# Patient Record
Sex: Female | Born: 1985 | Race: White | Hispanic: No | Marital: Married | Smoking: Never smoker
Health system: Southern US, Community
[De-identification: ages and names within clinical notes are randomized; demographics above are authoritative.]

## PROBLEM LIST (undated history)

## (undated) ENCOUNTER — Emergency Department: Discharge status: Absent without leave | Payer: No Typology Code available for payment source

## (undated) ENCOUNTER — Inpatient Hospital Stay: Payer: Self-pay

## (undated) DIAGNOSIS — O99113 Other diseases of the blood and blood-forming organs and certain disorders involving the immune mechanism complicating pregnancy, third trimester: Secondary | ICD-10-CM

## (undated) DIAGNOSIS — G43829 Menstrual migraine, not intractable, without status migrainosus: Secondary | ICD-10-CM

## (undated) DIAGNOSIS — D696 Thrombocytopenia, unspecified: Secondary | ICD-10-CM

## (undated) HISTORY — PX: WISDOM TOOTH EXTRACTION: SHX21

---

## 2006-07-04 ENCOUNTER — Ambulatory Visit: Payer: Self-pay | Admitting: Family Medicine

## 2006-07-24 ENCOUNTER — Ambulatory Visit: Payer: Self-pay | Admitting: Family Medicine

## 2008-04-28 ENCOUNTER — Ambulatory Visit: Payer: Self-pay | Admitting: Family Medicine

## 2008-04-28 ENCOUNTER — Encounter: Payer: Self-pay | Admitting: Family Medicine

## 2010-03-09 ENCOUNTER — Ambulatory Visit: Payer: Self-pay | Admitting: Obstetrics & Gynecology

## 2010-03-10 ENCOUNTER — Encounter: Payer: Self-pay | Admitting: Obstetrics & Gynecology

## 2010-04-06 ENCOUNTER — Ambulatory Visit: Payer: Self-pay | Admitting: Obstetrics & Gynecology

## 2010-06-08 ENCOUNTER — Ambulatory Visit: Payer: Self-pay | Admitting: Family Medicine

## 2010-10-04 ENCOUNTER — Ambulatory Visit
Admission: RE | Admit: 2010-10-04 | Discharge: 2010-10-04 | Payer: Self-pay | Source: Home / Self Care | Attending: Obstetrics and Gynecology | Admitting: Obstetrics and Gynecology

## 2010-10-04 ENCOUNTER — Ambulatory Visit: Admit: 2010-10-04 | Payer: Self-pay | Admitting: Obstetrics & Gynecology

## 2011-02-08 NOTE — Assessment & Plan Note (Signed)
NAME:  Joyce Schwartz, Joyce Schwartz           ACCOUNT NO.:  [id number]   MEDICAL RECORD NO.:  [id number]          PATIENT TYPE:  POB   LOCATION:  [facility] at [facility]         FACILITY:  [facility]   PHYSICIAN:  Tinnie Gens, MD        DATE OF BIRTH:  31-Jan-1986   DATE OF SERVICE:  04/28/2008                                  CLINIC NOTE   CHIEF COMPLAINT:  Yearly exam.   HISTORY OF PRESENT ILLNESS:  The patient is a 25 year old nulligravida  who is not sexually active, who is here for first exam.  She had good  luck on Nordette previously and we would like to get back on them if  possible.  She has no other significant complaints today.  She will get  married in May of next year.   PAST MEDICAL HISTORY:  Negative.   PAST SURGICAL HISTORY:  Negative.   MEDICATIONS:  None known.   ALLERGIES:  None known.   OBSTETRICAL HISTORY:  G 0.   GYNECOLOGICAL HISTORY:  Menarche at age 50.   FAMILY HISTORY:  For diabetes.   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  Her boyfriend is a IT sales professional.  She uses occasional alcohol, but  no smoking or other drugs.   REVIEW OF SYSTEMS:  Fourteen-point review of systems is reviewed.  She  denies headache, fever, chills, nausea, vomiting, diarrhea,  constipation, dysuria or lower extremity swelling.   PHYSICAL EXAMINATION:  VITAL SIGNS:  As noted in the chart.  GENERAL:  She is a well-developed and well-nourished female, in no acute  distress.  HEENT:  Normocephalic, atraumatic.  Sclerae anicteric.  NECK:  Supple.  Normal thyroid.  LUNGS:  Clear bilaterally.  CARDIOVASCULAR:  Regular rate and rhythm without rubs, gallops, or  murmurs.  ABDOMEN:  Soft, nontender, and nondistended.  EXTREMITIES:  No cyanosis, clubbing, or edema.  BREASTS:  Symmetric with everted nipples.  No masses.  No supraclavicular or axillary adenopathy.  GU:  Normal external female genitalia.  BUS is normal.  Vagina is pink  and rugated.  The hymenal  ring is intact.  Cervix is nulliparous without  lesion.  Uterus is small and anteverted.  No adnexal masses or  tenderness.   IMPRESSION:  Initial GYN exam.   PLAN:  1. Pap smear today.  2. I have refilled her Nordette.  3. Place PPD today per her request.  4. Discussed Gardasil.  The patient will think about this and let us      know about it on her return.           ______________________________  Tinnie Gens, MD     TP/MEDQ  D:  04/28/2008  T:  04/29/2008  Job:  [id number]

## 2011-02-08 NOTE — Assessment & Plan Note (Signed)
NAME:  Joyce Schwartz, Joyce Schwartz           ACCOUNT NO.:  [id number]   MEDICAL RECORD NO.:  [id number]          PATIENT TYPE:  POB   LOCATION:  [facility] at [facility]         FACILITY:  [facility]   PHYSICIAN:  Jaynie Collins, MD     DATE OF BIRTH:  02-05-1986   DATE OF SERVICE:                                  CLINIC NOTE   REASON FOR VISIT:  Yearly examination.   The patient is a 25 year old nulligravida, who is here for her yearly  examination.  The patient also complains of itchy white discharge, which  she noted 2 days ago.  No other systemic symptoms.  She is sexually  active and is using condoms for contraception but wants a prescription  for Levora.  She has no other significant gynecologic complaints.  Since  her last exam, the patient reports that she was married for 3 months and  then got divorced.  She is in a relationship right now and has been for  the last 7 months.  She feels very safe in her relationship and has no  other concerns.   PAST OB/GYN HISTORY:  G0, menarche at age 22, regular menses, no  sexually transmitted infections.  No history of cervical dysplasia.  Her  last Pap smear was in August 2009 and was negative.  The patient has not  received the Gardasil series and is interested in receiving this.   PAST MEDICAL HISTORY:  None.   PAST SURGICAL HISTORY:  None.   MEDICATIONS:  Multivitamins.   ALLERGIES:  No known drug allergies.   SOCIAL HISTORY:  The patient works as a Armed forces operational officer.  She drinks  social alcohol, but no smoke or denies any drugs.  She lives with her  parents.   FAMILY HISTORY:  Only remarkable for diabetes.  She denies any  gynecologic colon or breast cancer history.   REVIEW OF SYSTEMS:  A 14-point review of systems was extensively  reviewed.  She denies any symptoms except for what was reported in the  history of present illness.   PHYSICAL EXAMINATION:  VITAL SIGNS:  Pulse 68, blood pressure 118/76,  weight 252 pounds, height 5 feet 4  inches.  GENERAL:  No apparent distress.  HEENT:  Normocephalic, atraumatic.  NECK:  Supple.  Normal thyroid.  LUNGS:  Clear to auscultation bilaterally.  HEART:  Regular rate and rhythm.  ABDOMEN:  Soft, nontender, nondistended.  BREASTS:  Symmetric, soft, nontender.  No abnormal masses, skin changes,  nipple drainage, or lymphadenopathy noted.  EXTREMITIES:  No cyanosis, clubbing, or edema.  PELVIC:  Normal external female genitalia.  Pink, well-rugated vagina.  Nulliparous cervix without lesions.  Uterus is small and anteverted.  No  adnexal masses or tenderness.  Pap smear was obtained.  There was some  thick white discharge noted in the vagina and a sample was taken for wet  prep.  The patient has small amount of bleeding after the Pap smear.  Bimanual examination was normal.   IMPRESSION:  Annual gynecologic examination, also likely candidal  vaginitis.   PLAN:  1. We will follow up Pap smear results.  The patient is also      interested in having reflex gonorrhea and  Chlamydia checked but      declines other sexually transmitted infection screening.  2. We will follow up wet prep, but the patient was given a      prescription for Diflucan to treat for presumptive of candidiasis.  3. The patient was given a refill of her Levora for birth control.  I      told to continue using condoms for sexually transmitted infection      prevention.  4. We will set up for the patient to come back for her Gardasil series      vaccination.           ______________________________  Jaynie Collins, MD     UA/MEDQ  D:  03/09/2010  T:  03/10/2010  Job:  [id number]

## 2011-02-08 NOTE — Assessment & Plan Note (Signed)
NAME:  Joyce Schwartz, Joyce Schwartz           ACCOUNT NO.:  [id number]   MEDICAL RECORD NO.:  [id number]          PATIENT TYPE:  POB   LOCATION:  [facility]         FACILITY:  [facility]   PHYSICIAN:  Tinnie Gens, MD        DATE OF BIRTH:  Feb 13, 1986   DATE OF SERVICE:                                    CLINIC NOTE   [facility] [facility]   CHIEF COMPLAINT:  Yearly exam.   HISTORY OF PRESENT ILLNESS:  The patient is a 25 year old nulligravida who  has also never been sexually active, who is here for her first exam.  She  complains that her cycles are very irregular.  She sometimes goes 6-8 weeks  between cycles and then they may be fairly heavy when they come.  She also  has some significant pain with her periods.   The patient also complains of a rash between her breasts that seems to be  worrisome to her.   PAST MEDICAL HISTORY:  Negative.   PAST SURGICAL HISTORY:  Negative.   MEDICATIONS:  None.   ALLERGIES:  NONE KNOWN.   OBSTETRIC HISTORY:  G0.   GYNECOLOGIC HISTORY:  Menarche at age 63.  Irregular cycles as described  above.   FAMILY HISTORY:  Diabetes.   SOCIAL HISTORY:  The patient is a Consulting civil engineer at [employer].  She has the boyfriend she  has had for the last 6 years.  She lives with her parents and her sister.  She does not smoke, use alcohol or do any drugs.   REVIEW OF SYSTEMS:  A 14-point review of systems is reviewed.  Please see  GYN history in the chart.  It is negative.   PHYSICAL EXAMINATION:  VITAL SIGNS:  Blood pressure is 108/73, weight is 174  and pulse is 78.  GENERAL: She is a well-developed, well-nourished female in no acute  distress.  LUNGS:  Clear bilaterally.  CV:  Regular rate and rhythm.  No rubs, gallops or murmurs.  NECK:  Supple with normal thyroid.  BREASTS:  Symmetric with everted nipples.  No masses.  No supraclavicular or  axillary adenopathy.  ABDOMEN:  Soft, nontender and nondistended.  EXTREMITIES:  No cyanosis, clubbing  or edema and 2+ distal pulses.  GU EXAM:  Deferred at this time.  SKIN:  There is hypo and hyperpigmented areas along the chest of the patient  with some mild raised boarders.   IMPRESSION:  1. Irregular cycles.  2. Tinea versicolor.   PLAN:  1. Starting her on Depo on the Sunday after her next cycle starts.  This      will regulate her periods.  2. No dry shampoo to the areas affected by the tinea versicolor.  3. Discussed implications for starting annual Pap smear screening at age      22  or when she has been sexually active for 3 years.  Being that she is      not sexually active currently, 22 will be the time for her to come back      for her annual checkups.  4. Did advise about condom use and other safety issues, as well as alcohol  and drug use, seatbelt wear and skin care.           ______________________________  Tinnie Gens, MD     TP/MEDQ  D:  07/04/2006  T:  07/05/2006  Job:  [id number]

## 2011-04-08 ENCOUNTER — Other Ambulatory Visit: Payer: Self-pay | Admitting: Family Medicine

## 2012-05-14 ENCOUNTER — Inpatient Hospital Stay: Payer: Self-pay

## 2012-05-14 LAB — CBC WITH DIFFERENTIAL/PLATELET
Basophil %: 0.2 %
Eosinophil #: 0 10*3/uL (ref 0.0–0.7)
HCT: 38.8 % (ref 35.0–47.0)
HGB: 13.6 g/dL (ref 12.0–16.0)
Lymphocyte #: 1.2 10*3/uL (ref 1.0–3.6)
Monocyte #: 0.6 x10 3/mm (ref 0.2–0.9)
Neutrophil #: 13.8 10*3/uL — ABNORMAL HIGH (ref 1.4–6.5)
Neutrophil %: 88.5 %
RBC: 4.48 10*6/uL (ref 3.80–5.20)
RDW: 13.5 % (ref 11.5–14.5)
WBC: 15.6 10*3/uL — ABNORMAL HIGH (ref 3.6–11.0)

## 2012-05-16 LAB — HEMATOCRIT: HCT: 28.7 % — ABNORMAL LOW (ref 35.0–47.0)

## 2014-09-26 NOTE — L&D Delivery Note (Signed)
Delivery Note At 3:42 PM a viable female was delivered via Vaginal, Spontaneous Delivery (Presentation: direct OA).  APGAR: 8, 8; weight 7 lb 13.2 oz (3550 g).   Placenta status: Intact, Spontaneous.  Cord:  with the following complications: none apparent.  Cord pH:  n/a  Anesthesia:  none Episiotomy:  no Lacerations: 1st degree;Labial Suture Repair: 3.0 chromic Est. Blood Loss (mL):  250cc  Mom to postpartum.  Baby to Couplet care / Skin to Skin.  Patient progressed to complete and pushed, short 2nd stage.  Baby delivered and placed immediately on mom's chest.  Cord palpated until pulseless and then doubly clamped and cut by FOB.  Small right labial teal near precupice, repaired with 4-0 chromic in a figure of 8.  Placenta delivered after this and fundus was firm with massage and post partum pitocin bolus.  We sang happy birthday to JerseyHolden, and mom and baby recovered in the delivery room.  Ward, Chelsea C 08/08/2015, 4:34 PM

## 2015-02-03 NOTE — H&P (Signed)
L&D Evaluation:  History:   HPI 29 year old G1 P0 with EDC=05/13/2012 by a 449w1day ultrasound presents to L&D at 40 1/7 weeks with c/o strong regular contractions since 1630 today. Denies LOF but has had some bloody show. Baby active. PNC began in the first trimester and remarkable for a 9 week dating ultrasound, a negative first trimester test, an elevated one hr GTT with a normal 3hr GTT, and a negative GBS culture. Baby had a hydrocele noted on today's ultrasound. BPP 8/10 today (no breathing). LABS: B POS, RI, VI.    Presents with contractions    Patient's Medical History No Chronic Illness    Patient's Surgical History none    Medications Pre Natal Vitamins    Allergies NKDA    Social History none    Family History Non-Contributory   ROS:   ROS see HPI   Exam:   Vital Signs initial BP=140/82, repeat=110/72    General breathing with contractions    Mental Status clear    Chest clear    Heart normal sinus rhythm, no murmur/gallop/rubs    Abdomen gravid, tender with contractions    Estimated Fetal Weight Average for gestational age, EFW=7 1/2#    Fetal Position cephalic    Edema no edema    Reflexes 2+    Pelvic no external lesions, 3-4/80%/-1    Mebranes Intact    FHT normal rate with no decels, 130 baseline    Ucx q2    Skin dry   Impression:   Impression IUP at 40 1/7 weeks in early labor   Plan:   Plan EFM/NST, monitor contractions and for cervical change, Epidural when appropriate   Electronic Signatures: Trinna BalloonGutierrez, Aleksandra Raben L (CNM)  (Signed 19-Aug-13 19:49)  Authored: L&D Evaluation   Last Updated: 19-Aug-13 19:49 by Trinna BalloonGutierrez, Kejon Feild L (CNM)

## 2015-03-12 ENCOUNTER — Ambulatory Visit: Admission: RE | Admit: 2015-03-12 | Payer: Self-pay | Source: Ambulatory Visit

## 2015-03-16 ENCOUNTER — Ambulatory Visit
Admission: RE | Admit: 2015-03-16 | Discharge: 2015-03-16 | Disposition: A | Discharge status: Home / Self Care | Payer: No Typology Code available for payment source | Source: Ambulatory Visit | Attending: Obstetrics and Gynecology | Admitting: Obstetrics and Gynecology

## 2015-03-16 DIAGNOSIS — D696 Thrombocytopenia, unspecified: Secondary | ICD-10-CM | POA: Diagnosis not present

## 2015-03-16 DIAGNOSIS — O3660X0 Maternal care for excessive fetal growth, unspecified trimester, not applicable or unspecified: Secondary | ICD-10-CM

## 2015-03-16 LAB — CBC WITH DIFFERENTIAL/PLATELET
BASOS ABS: 0 10*3/uL (ref 0–0.1)
Basophils Relative: 0 %
Eosinophils Absolute: 0 10*3/uL (ref 0–0.7)
HCT: 38.4 % (ref 35.0–47.0)
Hemoglobin: 13.4 g/dL (ref 12.0–16.0)
Lymphs Abs: 1.2 10*3/uL (ref 1.0–3.6)
MCH: 29.8 pg (ref 26.0–34.0)
MCHC: 34.8 g/dL (ref 32.0–36.0)
MCV: 85.8 fL (ref 80.0–100.0)
Monocytes Absolute: 0.4 10*3/uL (ref 0.2–0.9)
Monocytes Relative: 4 %
NEUTROS ABS: 8.3 10*3/uL — AB (ref 1.4–6.5)
Neutrophils Relative %: 84 %
PLATELETS: 93 10*3/uL — AB (ref 150–440)
RBC: 4.48 MIL/uL (ref 3.80–5.20)
RDW: 13.3 % (ref 11.5–14.5)
WBC: 9.9 10*3/uL (ref 3.6–11.0)

## 2015-03-16 NOTE — Addendum Note (Signed)
Encounter addended by: Jimmey Ralph, MD on: 03/16/2015  4:37 PM<BR>     Documentation filed: Notes Section

## 2015-03-16 NOTE — Progress Notes (Signed)
[facility] Consultation   Chief Complaint: Low platelet count  on new ob labs   HPI: [name] is a 29 y.o. G2P1000 at [redacted]w[redacted]d by LMP 2/13 and EDC of 08/15/15 (confirmed by u/s on 4/11)   who presents in consultation from Dr Annamarie Major at [facility]  for Thrombocytopenia . Pt has had no problems with easy bleeding and was unaware of a platelet issue until she presented in labor with her now 29 yo,  Joyce Schwartz, in 2013 .  She had an uncomplicated pregnancy then  and her CBC showed a platelet count of 97K . She was unable to have an epidural due to low platelet count and she went on to have a VAVD of a 9lb 8oz female . Neither she not the infant had any sequelae - she was unaware of any plt problems for her son. She has had blood counts drawn for work outside of pregnancy and was never told of a problem. Likewise she was never notified of a problem on the blood work done earlier in her first pregnancy. (Epic only contains the delivery CBC) . She did not have other signs of preeclampsia in that pregnancy.  This pregnancy her first CBC showed 120K and f/u was 118K and 108K . She denies nosebleeds or rash . She is not on any medications except vitamins . HIV test was negative - she has no lupus sxs. No PMH to suggest plt  sequestration.  Past Medical History: Patient  has no past medical history on file.  Past Surgical History: She  has no past surgical history on file.  Obstetric History:  OB History    Gravida Para Term Preterm AB TAB SAB Ectopic Multiple Living   2 1 1             Gynecologic History:  Patient's last menstrual period was 11/08/2014.     Medications vits  Allergies: Patient has no allergies on file.  Social History: Patient   is married and has one son  Family History: family history is not on file.  Review of Systems A full 12 point review of systems was negative or as noted in the History of Present Illness.  Physical Exam: LMP 11/08/2014 There were no  vitals filed for this visit.   Well appearing WF  No rash    Asessement: 1. Thrombocytopenia   Asymptomatic . Pt reports no low plts outside of pregnancy. No results available since last delivery.  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 (She is HIV negative and has no sxs to suggest lupus related drop in platelets. Her other blood indices are normal . She is on no meds and has no liver or spleen dz. No recent viral illness.  ) I told her that both are diagnoses of exclusion and checking for antiplatelet antibodies is unlikely to be of assistance in her management .  I reassured the pt that at this platelet count she is unlikely to have any problems.  I reviewed with the patient that recent data suggests that even with ITP related drop in maternal plts,  ITP is unlikely to cause a clinically dangerous  drop in the fetus.  Labor is usually conducted normally now with no fetal platelet assessment. I do usually inform peds of maternal thrombocytopenia and offer to draw a  plt count on cord blood if they would like. And while I prefer to avoid operative vaginal delivery with maternal  ITP, there is not a strict contraindication . Cut off for Obstetric regional anesthesia varies among anesthesiologists usually between 60-100K and some will perform a one shot spinal at lower levels than an epidural  For cesarean to avoid GET anesthesia .   2 h/o LGA infant - neg early GCT , required operative vaginal delivery  Plan: 1 repeat CBC with manual platelet count to  r/o falsely low count due to clumping - ordered today  2 ANA to r/o autoimmune causes though unlikely  3 repeat CBC monthly  If platelet count continues to drift downward toward  the  30-50K range, recommend involvement of hematology to assist with trial of corticosteroids . I prefer a count >= 50K  in labor  Or for operative procedures.  RTC in 4 weeks for MFM consult to re-assess .    4 Consider third trimester growth san at 32 -34 weeks to assess fetal weight , if projected term EFW is >= 5K g  Offer primary cesarean   . Total time spent with the patient was 30 minutes with greater than 50% spent in counseling and coordination of care. We appreciate this interesting consult and will be happy to be involved in the ongoing care of Joyce Schwartz in anyway her obstetricians desire.  Jimmey Ralph, MD

## 2015-03-16 NOTE — Progress Notes (Signed)
Discussed with Dr Fayrene Fearing She thinks most c/w ITP  Refer to [facility] Mate pp She also suggests adding a CMP and hep C ab to next blood draw

## 2015-03-17 LAB — ANA COMPREHENSIVE PANEL

## 2015-04-20 ENCOUNTER — Ambulatory Visit
Admission: RE | Admit: 2015-04-20 | Discharge: 2015-04-20 | Disposition: A | Discharge status: Home / Self Care | Payer: No Typology Code available for payment source | Source: Ambulatory Visit | Attending: Obstetrics and Gynecology | Admitting: Obstetrics and Gynecology

## 2015-04-20 VITALS — BP 125/70 | HR 87 | Temp 98.0°F | Ht 65.0 in | Wt 172.0 lb

## 2015-04-20 DIAGNOSIS — D696 Thrombocytopenia, unspecified: Secondary | ICD-10-CM | POA: Insufficient documentation

## 2015-04-20 LAB — CBC WITH DIFFERENTIAL/PLATELET
BASOS ABS: 0 10*3/uL (ref 0–0.1)
Basophils Relative: 0 %
EOS ABS: 0 10*3/uL (ref 0–0.7)
Eosinophils Relative: 0 %
HEMATOCRIT: 36.7 % (ref 35.0–47.0)
HEMOGLOBIN: 12.8 g/dL (ref 12.0–16.0)
LYMPHS ABS: 1.2 10*3/uL (ref 1.0–3.6)
MCH: 29.7 pg (ref 26.0–34.0)
MCHC: 34.8 g/dL (ref 32.0–36.0)
MCV: 85.3 fL (ref 80.0–100.0)
Monocytes Absolute: 0.4 10*3/uL (ref 0.2–0.9)
Monocytes Relative: 4 %
NEUTROS ABS: 8.5 10*3/uL — AB (ref 1.4–6.5)
Neutrophils Relative %: 84 %
PLATELETS: 96 10*3/uL — AB (ref 150–440)
RBC: 4.3 MIL/uL (ref 3.80–5.20)
RDW: 13.7 % (ref 11.5–14.5)
WBC: 10.2 10*3/uL (ref 3.6–11.0)

## 2015-04-20 NOTE — Progress Notes (Signed)
MFM follow up consult.  Ms. Raybon presents for follow up discussion of her thrombocytopenia.  She was previously seen by Dr. Leatha Gilding (see full consult for details).  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  She was able to obtain her labs from her first pregnancy; plts were 160 at baseline and 102 at 28 weeks.  At the time of her delivery, they were 64 and she was unable to get an epidural for labor management.  She had no issues with PP bleeding and has had no issues with her menses, gum bleeding or nose bleeds or bruising since that time.  This pregnancy her plts have ranged from 120, 118, 108 and 93.  Today they are 96.  RPR was NR, HIV was negative and ANA panel was negative.  Medication history was reviewed and only meds include PNVs.  Agree with plan to follow cbc monthly.  We would be happy to review these results if needed and help to refer to hematology if plts drop to the 50-70K range for consideration of steroids.    Anesthesia consult may be beneficial to discuss goals of therapy if needed to maintain plts above a certain level for them to be comfortable with a regional anesthetic.

## 2015-06-15 ENCOUNTER — Encounter: Payer: No Typology Code available for payment source | Attending: Obstetrics & Gynecology | Admitting: Dietician

## 2015-06-15 VITALS — BP 130/78 | Ht 65.0 in | Wt 178.7 lb

## 2015-06-15 DIAGNOSIS — O24419 Gestational diabetes mellitus in pregnancy, unspecified control: Secondary | ICD-10-CM | POA: Insufficient documentation

## 2015-06-15 DIAGNOSIS — O24410 Gestational diabetes mellitus in pregnancy, diet controlled: Secondary | ICD-10-CM

## 2015-06-15 NOTE — Patient Instructions (Signed)
Read booklet on Gestational Diabetes Follow Gestational Meal Planning Guidelines Complete a 3 Day Food Record and bring to next appointment Check blood sugars 4 x day - before breakfast and 2 hrs after every meal and record  Call MD for prescription for meter strips and lancets Strips:  Ultra One Touch  Lancets:   One Touch Delica Bring blood sugar log to next appointment Walk 20-30 minutes at least 5 x week if permitted by MD Next appointment    06-22-15

## 2015-06-15 NOTE — Progress Notes (Signed)
Diabetes Self-Management Education  Visit Type: First/Initial  Appt. Start Time: 1030 Appt. End Time:1200  06/15/2015  [name], identified by name and date of birth, is a 29 y.o. female with a diagnosis of Diabetes: Gestational Diabetes.   ASSESSMENT  Blood pressure 130/78, height  (1.651 m), weight 178 lb 11.2 oz (81.058 kg), last menstrual period 11/08/2014. Body mass index is 29.74 kg/(m^2).      Diabetes Self-Management Education - 06/15/15 1322    Visit Information   Visit Type First/Initial   Initial Visit   Diabetes Type Gestational Diabetes   Health Coping   How would you rate your overall health? Good   Psychosocial Assessment   Patient Belief/Attitude about Diabetes Motivated to manage diabetes   Self-care barriers None   Other persons present Spouse/SO   Patient Concerns Healthy Lifestyle;Glycemic Control   Special Needs None   Preferred Learning Style Auditory;Visual   Learning Readiness Ready   How often do you need to have someone help you when you read instructions, pamphlets, or other written materials from your doctor or pharmacy? 1 - Never   What is the last grade level you completed in school? 16   Complications   How often do you check your blood sugar? 0 times/day (not testing)   Have you had a dilated eye exam in the past 12 months? No   Have you had a dental exam in the past 12 months? Yes   Are you checking your feet? No   Dietary Intake   Breakfast --  eats 3 meals/day-eats fried foods 2-3x/wk and snack foods 4-5x/wk.; eats sweets/desserts 2-3x/wk.   Snack (morning) --  eats am and bedtime snacks    Snack (afternoon) --  none   Beverage(s) --  drinks sweetened beverages 2-3/day   Exercise   Exercise Type --  walks 45 min 2-3x/wk   Patient Education   Previous Diabetes Education No   Disease state  Definition of diabetes, type 1 and 2, and the diagnosis of diabetes;Factors that contribute to the development of diabetes   Nutrition management  Role of diet in the treatment of diabetes and the relationship between the three main macronutrients and blood glucose level;Food label reading, portion sizes and measuring food.;Carbohydrate counting   Physical activity and exercise  Role of exercise on diabetes management, blood pressure control and cardiac health.   Monitoring Taught/evaluated SMBG meter.;Purpose and frequency of SMBG.;Identified appropriate SMBG and/or A1C goals.;Ketone testing, when, how.;Taught/discussed recording of test results and interpretation of SMBG.  gave pt Ultra One Touch 2 meter and instructed on its use-BG 76 (2 hr pp)   Acute complications Discussed and identified patients' treatment of hyperglycemia.   Psychosocial adjustment Role of stress on diabetes   Preconception care Pregnancy and GDM  Role of pre-pregnancy blood glucose control on the development of the fetus;Reviewed with patient blood glucose goals with pregnancy;Role of family planning for patients with diabetes   Personal strategies to promote health Lifestyle issues that need to be addressed for better diabetes care      Individualized Plan for Diabetes Self-Management Training:   Learning Objective:  Patient will have a greater understanding of diabetes self-management. Patient education plan is to attend individual and/or group sessions per assessed needs and concerns.   Plan:   Patient Instructions  Read booklet on Gestational Diabetes Follow Gestational Meal Planning Guidelines Complete a 3 Day Food Record and bring to next appointment Check blood sugars 4 x day - before breakfast  and 2 hrs after every meal and record  Call MD for prescription for meter strips and lancets Strips:  Ultra One Touch  Lancets:   One Touch Delica Bring blood sugar log to next appointment Walk 20-30 minutes at least 5 x week if permitted by MD Next appointment    06-22-15   Expected Outcomes:     Education material provided: General  meal planning guidelines for pregnancy, Ultra One Touch 2 meter  If problems or questions, patient to contact team via:  [phone]  Future DSME appointment:   06-22-15

## 2015-06-22 ENCOUNTER — Observation Stay: Payer: No Typology Code available for payment source

## 2015-06-22 ENCOUNTER — Ambulatory Visit: Payer: No Typology Code available for payment source | Admitting: Dietician

## 2015-06-22 ENCOUNTER — Observation Stay
Admission: RE | Admit: 2015-06-22 | Discharge: 2015-06-22 | Disposition: A | Discharge status: Home / Self Care | Payer: No Typology Code available for payment source | Source: Ambulatory Visit | Attending: Certified Nurse Midwife | Admitting: Certified Nurse Midwife

## 2015-06-22 DIAGNOSIS — Z3A32 32 weeks gestation of pregnancy: Secondary | ICD-10-CM | POA: Insufficient documentation

## 2015-06-22 DIAGNOSIS — D696 Thrombocytopenia, unspecified: Secondary | ICD-10-CM | POA: Insufficient documentation

## 2015-06-22 DIAGNOSIS — R05 Cough: Secondary | ICD-10-CM | POA: Diagnosis present

## 2015-06-22 DIAGNOSIS — R030 Elevated blood-pressure reading, without diagnosis of hypertension: Secondary | ICD-10-CM

## 2015-06-22 DIAGNOSIS — IMO0001 Reserved for inherently not codable concepts without codable children: Secondary | ICD-10-CM

## 2015-06-22 DIAGNOSIS — O24419 Gestational diabetes mellitus in pregnancy, unspecified control: Secondary | ICD-10-CM | POA: Diagnosis present

## 2015-06-22 DIAGNOSIS — O26892 Other specified pregnancy related conditions, second trimester: Secondary | ICD-10-CM | POA: Diagnosis not present

## 2015-06-22 DIAGNOSIS — J40 Bronchitis, not specified as acute or chronic: Secondary | ICD-10-CM | POA: Insufficient documentation

## 2015-06-22 DIAGNOSIS — R059 Cough, unspecified: Secondary | ICD-10-CM

## 2015-06-22 LAB — COMPREHENSIVE METABOLIC PANEL
ALBUMIN: 3 g/dL — AB (ref 3.5–5.0)
ALK PHOS: 101 U/L (ref 38–126)
ALT: 10 U/L — ABNORMAL LOW (ref 14–54)
ANION GAP: 6 (ref 5–15)
AST: 17 U/L (ref 15–41)
BILIRUBIN TOTAL: 0.8 mg/dL (ref 0.3–1.2)
BUN: 6 mg/dL (ref 6–20)
CALCIUM: 8.4 mg/dL — AB (ref 8.9–10.3)
CO2: 24 mmol/L (ref 22–32)
Chloride: 108 mmol/L (ref 101–111)
Creatinine, Ser: 0.54 mg/dL (ref 0.44–1.00)
GFR calc Af Amer: >60 mL/min (ref >60)
GLUCOSE: 80 mg/dL (ref 65–99)
POTASSIUM: 4.2 mmol/L (ref 3.5–5.1)
Sodium: 138 mmol/L (ref 135–145)
TOTAL PROTEIN: 6.2 g/dL — AB (ref 6.5–8.1)

## 2015-06-22 LAB — CBC
HEMATOCRIT: 35.7 % (ref 35.0–47.0)
HEMOGLOBIN: 12.4 g/dL (ref 12.0–16.0)
MCH: 29.9 pg (ref 26.0–34.0)
MCHC: 34.8 g/dL (ref 32.0–36.0)
MCV: 85.8 fL (ref 80.0–100.0)
Platelets: 114 10*3/uL — ABNORMAL LOW (ref 150–440)
RBC: 4.16 MIL/uL (ref 3.80–5.20)
RDW: 14 % (ref 11.5–14.5)
WBC: 10 10*3/uL (ref 3.6–11.0)

## 2015-06-22 LAB — PROTEIN / CREATININE RATIO, URINE
CREATININE, URINE: 36 mg/dL
PROTEIN CREATININE RATIO: 0.19 mg/mg{creat} — AB (ref 0.00–0.15)
Total Protein, Urine: 7 mg/dL

## 2015-06-22 MED ORDER — LEVALBUTEROL HCL 1.25 MG/0.5ML IN NEBU
1.2500 mg | INHALATION_SOLUTION | RESPIRATORY_TRACT | Status: DC | PRN
  Administered 2015-06-22: 1.25 mg via RESPIRATORY_TRACT
  Filled 2015-06-22 (×2): qty 0.5

## 2015-06-22 MED ORDER — AZITHROMYCIN 250 MG PO TABS
500.0000 mg | ORAL_TABLET | Freq: Once | ORAL | Status: AC
  Administered 2015-06-22: 500 mg via ORAL
  Filled 2015-06-22: qty 2

## 2015-06-22 MED ORDER — LACTATED RINGERS IV SOLN: 500.0000 mL | INTRAVENOUS | Status: DC | PRN

## 2015-06-22 NOTE — Progress Notes (Addendum)
L&D Triage Note   S: "I was able to cough up some sputum after that nebulizer treatment."  O:  Patient Vitals for the past 24 hrs:  BP Temp Temp src Pulse SpO2 Height Weight  06/22/15 1423 - - - - 95 % - -  06/22/15 1400 - - - - 94 % - -  06/22/15 1356 126/79 mmHg 98.6 F (37 C) Oral 94 - - -  06/22/15 1230 - - - - 95 % - -  06/22/15 1225 - - - - 95 % - -  06/22/15 1220 - - - - 96 % - -  06/22/15 1215 - - - - 96 % - -  06/22/15 1210 - - - - 96 % - -  06/22/15 1205 - - - - 96 % - -  06/22/15 1204 128/77 mmHg - - 91 - - -  06/22/15 1200 - - - - 96 % - -  06/22/15 1155 - - - - 96 % - -  06/22/15 1150 - - - - 96 % - -  06/22/15 1148 (!) 121/96 mmHg - - 91 - - -  06/22/15 1144 (!) 129/106 mmHg - - (!) 117 97 %  (1.651 m) 178 lb (80.74 kg)  General : in NAD, appears comfortable. Tolerated regular diet. Lungs: wheezing bilaterally, but better after nebulizer treatment FHR: reactive with baseline 150 and acceleration to 180s, mod variability Toco: acontractile  Results for orders placed or performed during the hospital encounter of 06/22/15 (from the past 24 hour(s))  CBC     Status: Abnormal   Collection Time: 06/22/15 12:40 PM  Result Value Ref Range   WBC 10.0 3.6 - 11.0 K/uL   RBC 4.16 3.80 - 5.20 MIL/uL   Hemoglobin 12.4 12.0 - 16.0 g/dL   HCT 16.1 09.6 - 04.5 %   MCV 85.8 80.0 - 100.0 fL   MCH 29.9 26.0 - 34.0 pg   MCHC 34.8 32.0 - 36.0 g/dL   RDW 40.9 81.1 - 91.4 %   Platelets 114 (L) 150 - 440 K/uL  Comprehensive metabolic panel     Status: Abnormal   Collection Time: 06/22/15 12:40 PM  Result Value Ref Range   Sodium 138 135 - 145 mmol/L   Potassium 4.2 3.5 - 5.1 mmol/L   Chloride 108 101 - 111 mmol/L   CO2 24 22 - 32 mmol/L   Glucose, Bld 80 65 - 99 mg/dL   BUN 6 6 - 20 mg/dL   Creatinine, Ser 7.82 0.44 - 1.00 mg/dL   Calcium 8.4 (L) 8.9 - 10.3 mg/dL   Total Protein 6.2 (L) 6.5 - 8.1 g/dL   Albumin 3.0 (L) 3.5 - 5.0 g/dL   AST 17 15 - 41 U/L   ALT 10 (L)  14 - 54 U/L   Alkaline Phosphatase 101 38 - 126 U/L   Total Bilirubin 0.8 0.3 - 1.2 mg/dL   GFR calc non Af Amer >60 >60 mL/min   GFR calc Af Amer >60 >60 mL/min   Anion gap 6 5 - 15  Protein / creatinine ratio, urine     Status: Abnormal   Collection Time: 06/22/15  2:42 PM  Result Value Ref Range   Creatinine, Urine 36 mg/dL   Total Protein, Urine 7 mg/dL   Protein Creatinine Ratio 0.19 (H) 0.00 - 0.15 mg/mg[Cre]  CXR: no evidence of any acute process  A: Bronchitis Transient blood pressure elevation-possibly due to anxiety-now normotensive Mild thrombocytopenia ( plt  count up from 95K in office)  P: DC home on Azithromycin and ProAir inhaler Increase water intake Can use Mucinex if desires FU in 2 days with me. To ER for worsening sx or fever>100.4  GUTIERREZ, COLLEEN, CNM

## 2015-06-22 NOTE — Progress Notes (Addendum)
L&D Triage Note  29 year old G2 P1001 with EDC=08/15/2015 by LMP=11/08/2014 presents from office with c/o coughing x 3 weeks. Cough has been productive and over the weekend her sputum changed to a yellowish green in color. Has tried Sudafed and Mucinex for her URI, but her symptoms have only worsened. Pulse ox in office was 90-91%. Also c/o rhinorrhea, reflux,  and mild sore throat from coughing. She denies fever, ear pain, and a hx of asthma or seasonal allergies.  Prenatal care has been remarkable for GDMA1 and  thrombocytopenia and her last plt count 8/30 was 95K. History is also remarkable for thrombocytopenia and macrosomia during her first pregnancy.   Exam:  Patient Vitals for the past 24 hrs:  BP Pulse SpO2 Height Weight  06/22/15 1200 - - 96 % - -  06/22/15 1155 - - 96 % - -  06/22/15 1150 - - 96 % - -  06/22/15 1148 (!) 121/96 mmHg 91 - - -  06/22/15 1144 (!) 129/106 mmHg (!) 117 97 %  (1.651 m) 178 lb (80.74 kg)   General: sitting up getting blood pressure in NAD. HENT: No frontal or maxillary sinus tenderness Nares: red nares externally. Mildly inflammed nasal mucosa OP: no exudates, not inflammed Ears: left TM: cone reflex seen, no inflammation. Rt TM not visualized Lungs: inspiratory and expiratory rhonchi and wheezing FHR: 145 with accelerations to 160s, mod variability Toco:  acontractile  A: IUP at 32.2 weeks with cough x 3 weeks  Pulse O2 WNL  R/O bronchitis, R/O pneumonia,   -CBC, CXR Thrombocytopenia and elevated pressures  -CMP and pro/cr ratio  Continue to monitor blood pressures FWB: Reactive NST/ Cat 1 tracing  GUTIERREZ, COLLEEN, CNM

## 2015-06-22 NOTE — Plan of Care (Signed)
Discharge instructions given oral and written per CNM and RN. Patient ready to leave department, stable and ambulatory condition. JB RN

## 2015-06-22 NOTE — Discharge Instructions (Signed)
Keep next appointment with CNM Sharen Hones on Wed. 06/24/15 at 2pm.  Prescription called into [facility].  Continue with ordered antibiotics from Dr. Vergie Living.Fetal Movement Counts Patient Name: __________________________________________________ Patient Due Date: ____________________ Performing a fetal movement count is highly recommended in high-risk pregnancies, but it is good for every pregnant woman to do. Your health care provider may ask you to start counting fetal movements at 28 weeks of the pregnancy. Fetal movements often increase:  After eating a full meal.  After physical activity.  After eating or drinking something sweet or cold.  At rest. Pay attention to when you feel the baby is most active. This will help you notice a pattern of your baby's sleep and wake cycles and what factors contribute to an increase in fetal movement. It is important to perform a fetal movement count at the same time each day when your baby is normally most active.  HOW TO COUNT FETAL MOVEMENTS 1. Find a quiet and comfortable area to sit or lie down on your left side. Lying on your left side provides the best blood and oxygen circulation to your baby. 2. Write down the day and time on a sheet of paper or in a journal. 3. Start counting kicks, flutters, swishes, rolls, or jabs in a 2-hour period. You should feel at least 10 movements within 2 hours. 4. If you do not feel 10 movements in 2 hours, wait 2-3 hours and count again. Look for a change in the pattern or not enough counts in 2 hours. SEEK MEDICAL CARE IF:  You feel less than 10 counts in 2 hours, tried twice.  There is no movement in over an hour.  The pattern is changing or taking longer each day to reach 10 counts in 2 hours.  You feel the baby is not moving as he or she usually does. Date: ____________ Movements: ____________ Start time: ____________ Joyce Schwartz time: ____________  Date: ____________ Movements: ____________ Start time:  ____________ Joyce Schwartz time: ____________ Date: ____________ Movements: ____________ Start time: ____________ Joyce Schwartz time: ____________ Date: ____________ Movements: ____________ Start time: ____________ Joyce Schwartz time: ____________ Date: ____________ Movements: ____________ Start time: ____________ Joyce Schwartz time: ____________ Date: ____________ Movements: ____________ Start time: ____________ Joyce Schwartz time: ____________ Date: ____________ Movements: ____________ Start time: ____________ Joyce Schwartz time: ____________ Date: ____________ Movements: ____________ Start time: ____________ Joyce Schwartz time: ____________  Date: ____________ Movements: ____________ Start time: ____________ Joyce Schwartz time: ____________ Date: ____________ Movements: ____________ Start time: ____________ Joyce Schwartz time: ____________ Date: ____________ Movements: ____________ Start time: ____________ Joyce Schwartz time: ____________ Date: ____________ Movements: ____________ Start time: ____________ Joyce Schwartz time: ____________ Date: ____________ Movements: ____________ Start time: ____________ Joyce Schwartz time: ____________ Date: ____________ Movements: ____________ Start time: ____________ Joyce Schwartz time: ____________ Date: ____________ Movements: ____________ Start time: ____________ Joyce Schwartz time: ____________  Date: ____________ Movements: ____________ Start time: ____________ Joyce Schwartz time: ____________ Date: ____________ Movements: ____________ Start time: ____________ Joyce Schwartz time: ____________ Date: ____________ Movements: ____________ Start time: ____________ Joyce Schwartz time: ____________ Date: ____________ Movements: ____________ Start time: ____________ Joyce Schwartz time: ____________ Date: ____________ Movements: ____________ Start time: ____________ Joyce Schwartz time: ____________ Date: ____________ Movements: ____________ Start time: ____________ Joyce Schwartz time: ____________ Date: ____________ Movements: ____________ Start time: ____________ Joyce Schwartz time: ____________  Date:  ____________ Movements: ____________ Start time: ____________ Joyce Schwartz time: ____________ Date: ____________ Movements: ____________ Start time: ____________ Joyce Schwartz time: ____________ Date: ____________ Movements: ____________ Start time: ____________ Joyce Schwartz time: ____________ Date: ____________ Movements: ____________ Start time: ____________ Joyce Schwartz time: ____________ Date: ____________ Movements: ____________ Start time: ____________ Joyce Schwartz time: ____________ Date: ____________ Movements: ____________  Start time: ____________ Joyce Schwartz time: ____________ Date: ____________ Movements: ____________ Start time: ____________ Joyce Schwartz time: ____________  Date: ____________ Movements: ____________ Start time: ____________ Joyce Schwartz time: ____________ Date: ____________ Movements: ____________ Start time: ____________ Joyce Schwartz time: ____________ Date: ____________ Movements: ____________ Start time: ____________ Joyce Schwartz time: ____________ Date: ____________ Movements: ____________ Start time: ____________ Joyce Schwartz time: ____________ Date: ____________ Movements: ____________ Start time: ____________ Joyce Schwartz time: ____________ Date: ____________ Movements: ____________ Start time: ____________ Joyce Schwartz time: ____________ Date: ____________ Movements: ____________ Start time: ____________ Joyce Schwartz time: ____________  Date: ____________ Movements: ____________ Start time: ____________ Joyce Schwartz time: ____________ Date: ____________ Movements: ____________ Start time: ____________ Joyce Schwartz time: ____________ Date: ____________ Movements: ____________ Start time: ____________ Joyce Schwartz time: ____________ Date: ____________ Movements: ____________ Start time: ____________ Joyce Schwartz time: ____________ Date: ____________ Movements: ____________ Start time: ____________ Joyce Schwartz time: ____________ Date: ____________ Movements: ____________ Start time: ____________ Joyce Schwartz time: ____________ Date: ____________ Movements: ____________ Start  time: ____________ Joyce Schwartz time: ____________  Date: ____________ Movements: ____________ Start time: ____________ Joyce Schwartz time: ____________ Date: ____________ Movements: ____________ Start time: ____________ Joyce Schwartz time: ____________ Date: ____________ Movements: ____________ Start time: ____________ Joyce Schwartz time: ____________ Date: ____________ Movements: ____________ Start time: ____________ Joyce Schwartz time: ____________ Date: ____________ Movements: ____________ Start time: ____________ Joyce Schwartz time: ____________ Date: ____________ Movements: ____________ Start time: ____________ Joyce Schwartz time: ____________ Date: ____________ Movements: ____________ Start time: ____________ Joyce Schwartz time: ____________  Date: ____________ Movements: ____________ Start time: ____________ Joyce Schwartz time: ____________ Date: ____________ Movements: ____________ Start time: ____________ Joyce Schwartz time: ____________ Date: ____________ Movements: ____________ Start time: ____________ Joyce Schwartz time: ____________ Date: ____________ Movements: ____________ Start time: ____________ Joyce Schwartz time: ____________ Date: ____________ Movements: ____________ Start time: ____________ Joyce Schwartz time: ____________ Date: ____________ Movements: ____________ Start time: ____________ Joyce Schwartz time: ____________ Document Released: 10/12/2006 Document Revised: 01/27/2014 Document Reviewed: 07/09/2012 Exit[employer] Patient Information 2015 [employer]. This information is not intended to replace advice given to you by your health care provider. Make sure you discuss any questions you have with your health care provider.

## 2015-06-30 ENCOUNTER — Telehealth: Payer: Self-pay | Admitting: Dietician

## 2015-06-30 NOTE — Telephone Encounter (Signed)
Called patient to reschedule RD appointment which was missed last week due to hospital admission.  Left a voicemail message for her to call back.

## 2015-07-10 ENCOUNTER — Encounter: Payer: Self-pay | Admitting: Dietician

## 2015-07-10 NOTE — Progress Notes (Signed)
Have not heard back from patient to reschedule her missed appointment. Sent discharge letter to MD.

## 2015-08-07 ENCOUNTER — Inpatient Hospital Stay
Admission: EM | Admit: 2015-08-07 | Discharge: 2015-08-10 | DRG: 775 | Disposition: A | Discharge status: Home / Self Care | Payer: No Typology Code available for payment source | Attending: Obstetrics & Gynecology | Admitting: Obstetrics & Gynecology

## 2015-08-07 DIAGNOSIS — O99213 Obesity complicating pregnancy, third trimester: Secondary | ICD-10-CM

## 2015-08-07 DIAGNOSIS — O3660X0 Maternal care for excessive fetal growth, unspecified trimester, not applicable or unspecified: Secondary | ICD-10-CM | POA: Diagnosis present

## 2015-08-07 DIAGNOSIS — Z808 Family history of malignant neoplasm of other organs or systems: Secondary | ICD-10-CM

## 2015-08-07 DIAGNOSIS — O9912 Other diseases of the blood and blood-forming organs and certain disorders involving the immune mechanism complicating childbirth: Secondary | ICD-10-CM | POA: Diagnosis present

## 2015-08-07 DIAGNOSIS — O24410 Gestational diabetes mellitus in pregnancy, diet controlled: Secondary | ICD-10-CM | POA: Diagnosis present

## 2015-08-07 DIAGNOSIS — D696 Thrombocytopenia, unspecified: Secondary | ICD-10-CM | POA: Diagnosis present

## 2015-08-07 DIAGNOSIS — O0993 Supervision of high risk pregnancy, unspecified, third trimester: Secondary | ICD-10-CM

## 2015-08-07 DIAGNOSIS — O24420 Gestational diabetes mellitus in childbirth, diet controlled: Secondary | ICD-10-CM | POA: Diagnosis present

## 2015-08-07 DIAGNOSIS — D6959 Other secondary thrombocytopenia: Secondary | ICD-10-CM | POA: Diagnosis present

## 2015-08-07 DIAGNOSIS — Z3A39 39 weeks gestation of pregnancy: Secondary | ICD-10-CM

## 2015-08-07 DIAGNOSIS — Z6831 Body mass index (BMI) 31.0-31.9, adult: Secondary | ICD-10-CM

## 2015-08-07 DIAGNOSIS — Z833 Family history of diabetes mellitus: Secondary | ICD-10-CM

## 2015-08-07 DIAGNOSIS — Z79899 Other long term (current) drug therapy: Secondary | ICD-10-CM | POA: Diagnosis not present

## 2015-08-07 DIAGNOSIS — Z800 Family history of malignant neoplasm of digestive organs: Secondary | ICD-10-CM | POA: Diagnosis not present

## 2015-08-07 DIAGNOSIS — O24419 Gestational diabetes mellitus in pregnancy, unspecified control: Secondary | ICD-10-CM | POA: Diagnosis present

## 2015-08-07 HISTORY — DX: Other diseases of the blood and blood-forming organs and certain disorders involving the immune mechanism complicating pregnancy, third trimester: O99.113

## 2015-08-07 HISTORY — DX: Menstrual migraine, not intractable, without status migrainosus: G43.829

## 2015-08-07 HISTORY — DX: Other diseases of the blood and blood-forming organs and certain disorders involving the immune mechanism complicating pregnancy, third trimester: D69.6

## 2015-08-07 LAB — GLUCOSE, CAPILLARY: GLUCOSE-CAPILLARY: 66 mg/dL (ref 65–99)

## 2015-08-07 MED ORDER — LACTATED RINGERS IV SOLN: 500.0000 mL | INTRAVENOUS | Status: DC | PRN

## 2015-08-07 MED ORDER — DINOPROSTONE 10 MG VA INST
10.0000 mg | VAGINAL_INSERT | Freq: Once | VAGINAL | Status: AC
  Administered 2015-08-08: 10 mg via VAGINAL
  Filled 2015-08-07: qty 1

## 2015-08-07 MED ORDER — OXYTOCIN BOLUS FROM INFUSION
500.0000 mL | INTRAVENOUS | Status: DC
  Administered 2015-08-08: 500 mL via INTRAVENOUS

## 2015-08-07 MED ORDER — LACTATED RINGERS IV SOLN
INTRAVENOUS | Status: DC
  Administered 2015-08-07: 23:00:00 via INTRAVENOUS
  Administered 2015-08-08: 125 mL/h via INTRAVENOUS

## 2015-08-07 MED ORDER — OXYTOCIN 40 UNITS IN LACTATED RINGERS INFUSION - SIMPLE MED
62.5000 mL/h | INTRAVENOUS | Status: DC
  Administered 2015-08-08: 62.5 mL/h via INTRAVENOUS

## 2015-08-07 MED ORDER — LIDOCAINE HCL (PF) 1 % IJ SOLN
30.0000 mL | INTRAMUSCULAR | Status: DC | PRN
  Filled 2015-08-07: qty 30

## 2015-08-07 MED ORDER — ZOLPIDEM TARTRATE 5 MG PO TABS: 5.0000 mg | ORAL_TABLET | Freq: Once | ORAL | Status: DC

## 2015-08-07 MED ORDER — ONDANSETRON HCL 4 MG/2ML IJ SOLN: 4.0000 mg | Freq: Four times a day (QID) | INTRAMUSCULAR | Status: DC | PRN

## 2015-08-08 ENCOUNTER — Encounter: Payer: Self-pay | Admitting: Obstetrics and Gynecology

## 2015-08-08 DIAGNOSIS — O99213 Obesity complicating pregnancy, third trimester: Secondary | ICD-10-CM

## 2015-08-08 DIAGNOSIS — Z3A39 39 weeks gestation of pregnancy: Secondary | ICD-10-CM

## 2015-08-08 DIAGNOSIS — O0993 Supervision of high risk pregnancy, unspecified, third trimester: Secondary | ICD-10-CM

## 2015-08-08 DIAGNOSIS — Z6831 Body mass index (BMI) 31.0-31.9, adult: Secondary | ICD-10-CM

## 2015-08-08 LAB — CBC
HCT: 37.2 % (ref 35.0–47.0)
Hemoglobin: 12.8 g/dL (ref 12.0–16.0)
MCH: 30.3 pg (ref 26.0–34.0)
MCHC: 34.3 g/dL (ref 32.0–36.0)
MCV: 88.4 fL (ref 80.0–100.0)
PLATELETS: 91 10*3/uL — AB (ref 150–440)
RBC: 4.21 MIL/uL (ref 3.80–5.20)
RDW: 14.5 % (ref 11.5–14.5)
WBC: 7.8 10*3/uL (ref 3.6–11.0)

## 2015-08-08 LAB — TYPE AND SCREEN
ABO/RH(D): B POS
Antibody Screen: NEGATIVE

## 2015-08-08 LAB — ABO/RH: ABO/RH(D): B POS

## 2015-08-08 MED ORDER — ACETAMINOPHEN 325 MG PO TABS: 650.0000 mg | ORAL_TABLET | ORAL | Status: DC | PRN

## 2015-08-08 MED ORDER — LIDOCAINE HCL (PF) 1 % IJ SOLN
INTRAMUSCULAR | Status: AC
  Filled 2015-08-08: qty 30

## 2015-08-08 MED ORDER — WITCH HAZEL-GLYCERIN EX PADS: 1.0000 "application " | MEDICATED_PAD | CUTANEOUS | Status: DC | PRN

## 2015-08-08 MED ORDER — OXYTOCIN 10 UNIT/ML IJ SOLN
INTRAMUSCULAR | Status: AC
  Filled 2015-08-08: qty 2

## 2015-08-08 MED ORDER — BENZOCAINE-MENTHOL 20-0.5 % EX AERO
INHALATION_SPRAY | CUTANEOUS | Status: AC
  Administered 2015-08-08: 23:00:00
  Filled 2015-08-08: qty 56

## 2015-08-08 MED ORDER — DIPHENHYDRAMINE HCL 25 MG PO CAPS: 25.0000 mg | ORAL_CAPSULE | Freq: Four times a day (QID) | ORAL | Status: DC | PRN

## 2015-08-08 MED ORDER — ONDANSETRON HCL 4 MG PO TABS: 4.0000 mg | ORAL_TABLET | ORAL | Status: DC | PRN

## 2015-08-08 MED ORDER — OXYTOCIN 40 UNITS IN LACTATED RINGERS INFUSION - SIMPLE MED: 62.5000 mL/h | INTRAVENOUS | Status: DC | PRN

## 2015-08-08 MED ORDER — PRENATAL MULTIVITAMIN CH
1.0000 | ORAL_TABLET | Freq: Every day | ORAL | Status: DC
Start: 2015-08-09 — End: 2015-08-10
  Administered 2015-08-09 – 2015-08-10 (×2): 1 via ORAL
  Filled 2015-08-08 (×2): qty 1

## 2015-08-08 MED ORDER — OXYTOCIN 40 UNITS IN LACTATED RINGERS INFUSION - SIMPLE MED
INTRAVENOUS | Status: AC
  Administered 2015-08-08: 62.5 mL/h via INTRAVENOUS
  Filled 2015-08-08: qty 1000

## 2015-08-08 MED ORDER — INFLUENZA VAC SPLIT QUAD 0.5 ML IM SUSY: 0.5000 mL | PREFILLED_SYRINGE | INTRAMUSCULAR | Status: DC | PRN

## 2015-08-08 MED ORDER — LANOLIN HYDROUS EX OINT: TOPICAL_OINTMENT | CUTANEOUS | Status: DC | PRN

## 2015-08-08 MED ORDER — DOCUSATE SODIUM 100 MG PO CAPS
100.0000 mg | ORAL_CAPSULE | Freq: Two times a day (BID) | ORAL | Status: DC
  Administered 2015-08-08 – 2015-08-10 (×4): 100 mg via ORAL
  Filled 2015-08-08 (×4): qty 1

## 2015-08-08 MED ORDER — DIBUCAINE 1 % RE OINT: 1.0000 "application " | TOPICAL_OINTMENT | RECTAL | Status: DC | PRN

## 2015-08-08 MED ORDER — AMMONIA AROMATIC IN INHA
RESPIRATORY_TRACT | Status: AC
  Filled 2015-08-08: qty 10

## 2015-08-08 MED ORDER — FENTANYL CITRATE (PF) 100 MCG/2ML IJ SOLN: 50.0000 ug | Freq: Once | INTRAMUSCULAR | Status: DC

## 2015-08-08 MED ORDER — SIMETHICONE 80 MG PO CHEW: 80.0000 mg | CHEWABLE_TABLET | ORAL | Status: DC | PRN

## 2015-08-08 MED ORDER — ONDANSETRON HCL 4 MG/2ML IJ SOLN: 4.0000 mg | INTRAMUSCULAR | Status: DC | PRN

## 2015-08-08 MED ORDER — FENTANYL CITRATE (PF) 100 MCG/2ML IJ SOLN
INTRAMUSCULAR | Status: AC
  Administered 2015-08-08: 50 ug
  Filled 2015-08-08: qty 2

## 2015-08-08 MED ORDER — IBUPROFEN 600 MG PO TABS
600.0000 mg | ORAL_TABLET | Freq: Four times a day (QID) | ORAL | Status: DC
  Administered 2015-08-08 – 2015-08-10 (×7): 600 mg via ORAL
  Filled 2015-08-08 (×7): qty 1

## 2015-08-08 MED ORDER — MISOPROSTOL 200 MCG PO TABS
ORAL_TABLET | ORAL | Status: AC
  Filled 2015-08-08: qty 4

## 2015-08-08 NOTE — H&P (Signed)
See H&P typed in chart.  Reviewed chart and patient examined with no changes.    Pregnancy complicated by macrosomic infant with G1 (weight 9lb 8oz) with uterine atony, gestational thrombocytopenia vs ITP (was present with G1), GDMA1.   Lab Results  Component Value Date   WBC 7.8 08/07/2015   HGB 12.8 08/07/2015   HCT 37.2 08/07/2015   PLT 91* 08/07/2015   GLUCAP 66 08/07/2015   Cervidil for cervical ripening.   Fingerstick blood glucose on admission is 66 mg/dL FWB reassuring overall.  Thomasene MohairStephen Luise Yamamoto, MD 08/08/2015 1:51 AM

## 2015-08-08 NOTE — Plan of Care (Signed)
Problem: Activity: Goal: Will verbalize the importance of balancing activity with adequate rest periods Outcome: Progressing 1. Up with assist while IV is in Place.  Problem: Education: Goal: Knowledge of condition will improve Outcome: Progressing 1. Mom is practicing Skin to Skin 2. Lactation has been working with Pt. Mom breast fed for almost a year with first born. 3. Peri-Care initiated and  Dermaplast provided as well as Ice Packs  3. Instructed void when she has the urge. 4. Mom v/o of calling RN for any increase in bleeding or any other C/O. I also instructed Mom in Thrombocytopenic Precautions.  Problem: Life Cycle: Goal: Risk for postpartum hemorrhage will decrease Outcome: Progressing 1. Mom is U/Even with small flow at this time.  2. Mom instructed to call for increase in bleeding and/or passing of clots. She V/O 3. Mom instructed in S/S excessive bleeding and Thrombocytopenic Precautions. She V/O. Goal: Chance of risk for complications during the postpartum period will decrease Outcome: Progressing 1. No s/s complications at this time.  Problem: Pain Management: Goal: General experience of comfort will improve and pain level will decrease Outcome: Progressing 1. Receiving Scheduled Ibuprofen Q6h.  Problem: Bowel/Gastric: Goal: Gastrointestinal status will improve Outcome: Progressing 1. Receiving Colace as Scheduled.

## 2015-08-08 NOTE — Progress Notes (Signed)
Pt. States, "Tingling" in arms has improved. K-pad provided.

## 2015-08-08 NOTE — Discharge Summary (Signed)
Obstetrical Discharge Summary  Patient Name: Joyce Schwartz DOB: 09/01/1986 MRN: [id number]  Date of Admission: 08/07/2015 Date of Discharge: 08/10/2015  Primary OB: [facility]  Gestational Age at Delivery: 406w0d   Antepartum complications: GDMA1, BMI 31, history of post partum hemorrhage, history of macrosomia, and gestational thrombocytopenia Admitting Diagnosis: as above Augmentation: Cervidil, AROM Complications: None Intrapartum complications/course: patient was brought in for cervical ripening, progressed to 5cm, AROM for clear fluid and progressed to complete.  She received Fentanyl for pain control x1.  She delivered without complication and had a small labial laceration. Date of Delivery: 08/08/15 Delivered By: Leeroy Bockhelsea Ward Delivery Type: spontaneous vaginal delivery Anesthesia:none Placenta: sponatneous Laceration: right labial, repaired with a 4-0 chromic figure of 8. Episiotomy: none Newborn Data: Live born female  Birth Weight: 7 lb 13.2 oz (3550 g) APGAR: 8, 8  Discharge Physical Exam:  BP 118/62 mmHg  Pulse 80  Temp(Src) 98.9 F (37.2 C) (Oral)  Resp 18  Ht 5\' 5"  (1.651 m)  Wt 183 lb (83.008 kg)  BMI 30.45 kg/m2  SpO2 99%  LMP 11/08/2014  Breastfeeding? Unknown  General: NAD CV: RRR Pulm: CTABL, nl effort ABD: s/nd/nt, fundus firm and below the umbilicus Lochia: minimal DVT Evaluation: LE non-ttp, no evidence of DVT on exam.   Recent Labs Lab 08/07/15 2315 08/09/15 0701  WBC 7.8 10.5  HGB 12.8 12.4  HCT 37.2 37.9  PLT 91* 86*    Post partum course: uncomplicated.  Postpartum Procedures: none Disposition: stable, discharge to home. Baby Feeding: breastmilk Baby Disposition: home with mom  Rh Immune globulin given: no Rubella vaccine given: no Tdap vaccine given in AP or PP setting: antepartum Flu vaccine given in AP or PP setting: postpartum ordered  Contraception: mini-pill  Prenatal Labs:  B+/RI/VI/ HIV, HepB, RPR neg/GBS  neg   Plan:  Joyce GarbeWhitney W Mounts was discharged to home in good condition. [facility] Elesa MassedWard in 4 weeks   Discharge Medications:   Medication List    TAKE these medications        docusate sodium 100 MG capsule  Commonly known as:  COLACE  Take 1 capsule (100 mg total) by mouth 2 (two) times daily as needed for mild constipation.     ibuprofen 600 MG tablet  Commonly known as:  ADVIL,MOTRIN  Take 1 tablet (600 mg total) by mouth every 6 (six) hours as needed.     norethindrone 0.35 MG tablet  Commonly known as:  MICRONOR,CAMILA,ERRIN  Take 1 tablet (0.35 mg total) by mouth daily. Start on 09/07/2015. Take at the same time qday. Consider effective after 7 days of use.     oxyCODONE-acetaminophen 5-325 MG tablet  Commonly known as:  ROXICET  Take 1 tablet by mouth every 6 (six) hours as needed for severe pain.     prenatal multivitamin Tabs tablet  Take 1 tablet by mouth daily at 12 noon.     pseudoephedrine 30 MG tablet  Commonly known as:  SUDAFED  Take 30 mg by mouth every 6 (six) hours as needed for congestion.      ASK your doctor about these medications        guaiFENesin 600 MG 12 hr tablet  Commonly known as:  MUCINEX  Take by mouth 2 (two) times daily as needed.       Cornelia Copaharlie Taris Galindo, Jr MD [facility]  Pager: [phone]

## 2015-08-08 NOTE — Progress Notes (Signed)
Pt. With c/o, "Tingling" in arms bilat. Pt. Is alert and oriented with cheerful affect. Color good, skin w&d. BBS clear. HR reg. Afeb. VSS. Denies H/A. Assisted up to bathroom and Pt. has steady gait and denies syncope. Voided 300cc. Lochial flow is moderate with 2 very small clots. Dr. Salena Saner. Ward notified of Pt. C/O and Lochia that is moderate with 2 very small clots.  Fundus is firm at U/E. Will obtain heating pad as Per Dr. Barney DrainWards suggestion for Pt.'s c/o. Will cont. To monitor closely.

## 2015-08-09 LAB — CBC
HCT: 37.9 % (ref 35.0–47.0)
HEMOGLOBIN: 12.4 g/dL (ref 12.0–16.0)
MCH: 29 pg (ref 26.0–34.0)
MCHC: 32.8 g/dL (ref 32.0–36.0)
MCV: 88.6 fL (ref 80.0–100.0)
Platelets: 86 10*3/uL — ABNORMAL LOW (ref 150–440)
RBC: 4.27 MIL/uL (ref 3.80–5.20)
RDW: 14.4 % (ref 11.5–14.5)
WBC: 10.5 10*3/uL (ref 3.6–11.0)

## 2015-08-09 LAB — RPR: RPR: NONREACTIVE

## 2015-08-09 NOTE — Progress Notes (Signed)
Post Partum Day 1 Subjective: Doing well, no complaints.  Tolerating regular diet, pain with PO meds, voiding and ambulating without difficulty.  Lochia moderate but decreasing.  No CP SOB F/C N/V or leg pain  Objective: BP 100/57 mmHg  Pulse 70  Temp(Src) 98.2 F (36.8 C) (Oral)  Resp 18   SpO2 98%   Physical Exam:  General: NAD CV: RRR Pulm: nl effort, CTABL Lochia: moderate Uterine Fundus: fundus firm and below umbilicus DVT Evaluation: no cords, ttp LEs    Recent Labs  08/07/15 2315 08/09/15 0701  HGB 12.8 12.4  HCT 37.2 37.9  WBC 7.8 10.5  PLT 91* 86*    Assessment/Plan: 29 y.o. G2P2002 postpartum day # 1  1. Doing well post partum; continue routine cares 2. Thrombocytopenia: decreased likely due to delivery blood loss; no evidence of circulation compromise.  No intervention. 3. Breastfeeding - lactation consultant supervising 4. Anticipated d/c home tomorrow.    ----- Ranae Plumberhelsea Ward, MD Attending Obstetrician and Gynecologist [facility]

## 2015-08-10 MED ORDER — OXYCODONE-ACETAMINOPHEN 5-325 MG PO TABS: 1.0000 | ORAL_TABLET | Freq: Four times a day (QID) | ORAL | Status: DC | PRN

## 2015-08-10 MED ORDER — IBUPROFEN 600 MG PO TABS: 600.0000 mg | ORAL_TABLET | Freq: Four times a day (QID) | ORAL | Status: DC | PRN

## 2015-08-10 MED ORDER — HYDROCORTISONE ACE-PRAMOXINE 1-1 % RE FOAM
1.0000 | Freq: Two times a day (BID) | RECTAL | Status: DC
  Filled 2015-08-10: qty 10

## 2015-08-10 MED ORDER — NORETHINDRONE 0.35 MG PO TABS: 1.0000 | ORAL_TABLET | Freq: Every day | ORAL | Status: DC

## 2015-08-10 MED ORDER — DOCUSATE SODIUM 100 MG PO CAPS: 100.0000 mg | ORAL_CAPSULE | Freq: Two times a day (BID) | ORAL | Status: DC | PRN

## 2015-08-10 MED ORDER — BENZOCAINE-MENTHOL 20-0.5 % EX AERO
INHALATION_SPRAY | CUTANEOUS | Status: AC
  Filled 2015-08-10: qty 56

## 2015-08-10 NOTE — Discharge Instructions (Signed)
Vaginal Delivery, Care After Refer to this sheet in the next few weeks. These discharge instructions provide you with information on caring for yourself after delivery. Your caregiver may also give you specific instructions. Your treatment has been planned according to the most current medical practices available, but problems sometimes occur. Call your caregiver if you have any problems or questions after you go home. HOME CARE INSTRUCTIONS 1. Take over-the-counter or prescription medicines only as directed by your caregiver or pharmacist. 2. Do not drink alcohol, especially if you are breastfeeding or taking medicine to relieve pain. 3. Do not smoke tobacco. 4. Continue to use good perineal care. Good perineal care includes: 1. Wiping your perineum from back to front 2. Keeping your perineum clean. 3. You can do sitz baths twice a day, to help keep this area clean 5. Do not use tampons, douche or have sex until your caregiver says it is okay. 6. Shower only and avoid sitting in submerged water, aside from sitz baths 7. Wear a well-fitting bra that provides breast support. 8. Eat healthy foods. 9. Drink enough fluids to keep your urine clear or pale yellow. 10. Eat high-fiber foods such as whole grain cereals and breads, brown rice, beans, and fresh fruits and vegetables every day. These foods may help prevent or relieve constipation. 11. Avoid constipation with high fiber foods or medications, such as miralax or metamucil 12. Follow your caregiver's recommendations regarding resumption of activities such as climbing stairs, driving, lifting, exercising, or traveling. 13. Talk to your caregiver about resuming sexual activities. Resumption of sexual activities is dependent upon your risk of infection, your rate of healing, and your comfort and desire to resume sexual activity. 14. Try to have someone help you with your household activities and your newborn for at least a few days after you leave  the hospital. 15. Rest as much as possible. Try to rest or take a nap when your newborn is sleeping. 16. Increase your activities gradually. 17. Keep all of your scheduled postpartum appointments. It is very important to keep your scheduled follow-up appointments. At these appointments, your caregiver will be checking to make sure that you are healing physically and emotionally. SEEK MEDICAL CARE IF:   You are passing large clots from your vagina. Save any clots to show your caregiver.  You have a foul smelling discharge from your vagina.  You have trouble urinating.  You are urinating frequently.  You have pain when you urinate.  You have a change in your bowel movements.  You have increasing redness, pain, or swelling near your vaginal incision (episiotomy) or vaginal tear.  You have pus draining from your episiotomy or vaginal tear.  Your episiotomy or vaginal tear is separating.  You have painful, hard, or reddened breasts.  You have a severe headache.  You have blurred vision or see spots.  You feel sad or depressed.  You have thoughts of hurting yourself or your newborn.  You have questions about your care, the care of your newborn, or medicines.  You are dizzy or light-headed.  You have a rash.  You have nausea or vomiting.  You were breastfeeding and have not had a menstrual period within 12 weeks after you stopped breastfeeding.  You are not breastfeeding and have not had a menstrual period by the 12th week after delivery.  You have a fever. SEEK IMMEDIATE MEDICAL CARE IF:   You have persistent pain.  You have chest pain.  You have shortness of breath.    You faint.  You have leg pain.  You have stomach pain.  Your vaginal bleeding saturates two or more sanitary pads in 1 hour. MAKE SURE YOU:   Understand these instructions.  Will watch your condition.  Will get help right away if you are not doing well or get worse. Document Released:  09/09/2000 Document Revised: 01/27/2014 Document Reviewed: 05/09/2012 ExitCare Patient Information 2015 [employer]. This information is not intended to replace advice given to you by your health care provider. Make sure you discuss any questions you have with your health care provider.  Sitz Bath A sitz bath is a warm water bath taken in the sitting position. The water covers only the hips and butt (buttocks). We recommend using one that fits in the toilet, to help with ease of use and cleanliness. It may be used for either healing or cleaning purposes. Sitz baths are also used to relieve pain, itching, or muscle tightening (spasms). The water may contain medicine. Moist heat will help you heal and relax.  HOME CARE  Take 3 to 4 sitz baths a day. 18. Fill the bathtub half-full with warm water. 19. Sit in the water and open the drain a little. 20. Turn on the warm water to keep the tub half-full. Keep the water running constantly. 21. Soak in the water for 15 to 20 minutes. 22. After the sitz bath, pat the affected area dry. GET HELP RIGHT AWAY IF: You get worse instead of better. Stop the sitz baths if you get worse. MAKE SURE YOU:  Understand these instructions.  Will watch your condition.  Will get help right away if you are not doing well or get worse. Document Released: 10/20/2004 Document Revised: 06/06/2012 Document Reviewed: 01/10/2011 ExitCare Patient Information 2015 [employer]. This information is not intended to replace advice given to you by your health care provider. Make sure you discuss any questions you have with your health care provider.    

## 2015-08-10 NOTE — Progress Notes (Signed)
Discharged to home via wc with newborn and husband.  Rx given for home use.  To car via auxillary

## 2015-08-10 NOTE — Progress Notes (Signed)
Daily Post Partum Note  Joyce GarbeWhitney W Schwartz is a 29 y.o. [id number] PPD#2 s/p SVD/1st (labial, repaired)  @ [id number].  Pregnancy c/b GDMA1, BMI 31, gestational thrombocytopenia  24hr/overnight events:  none  Subjective:  Pt doing well. Minimal lochia. Meeting all PP goals.  Objective:    Current Vital Signs 24h Vital Sign Ranges  T 98.2 F (36.8 C) Temp  Avg: 98.1 F (36.7 C)  Min: 97.9 F (36.6 C)  Max: 98.2 F (36.8 C)  BP 105/65 mmHg BP  Min: 100/57  Max: 115/66  HR 65 Pulse  Avg: 69.3  Min: 65  Max: 76  RR 18 Resp  Avg: 18  Min: 18  Max: 18  SaO2 100 % RA SpO2  Avg: 100 %  Min: 100 %  Max: 100 %       24 Hour I/O Current Shift I/O  Time Ins Outs        General: NAD Abdomen: +BS, soft, NTTP, ND, FF below the umbilicus Perineum: deferred Skin:  Warm and dry.  Cardiovascular:Regular rate and rhythm. Respiratory:  Clear to auscultation bilateral. Normal respiratory effort Extremities: no c/c/e  Medications Current Facility-Administered Medications  Medication Dose Route Frequency Provider Last Rate Last Dose  . acetaminophen (TYLENOL) tablet 650 mg  650 mg Oral Q4H PRN Chelsea C Ward, MD      . witch hazel-glycerin (TUCKS) pad 1 application  1 application Topical PRN Chelsea C Ward, MD       And  . dibucaine (NUPERCAINAL) 1 % rectal ointment 1 application  1 application Rectal PRN Chelsea C Ward, MD      . diphenhydrAMINE (BENADRYL) capsule 25 mg  25 mg Oral Q6H PRN Chelsea C Ward, MD      . docusate sodium (COLACE) capsule 100 mg  100 mg Oral BID Elenora Fenderhelsea C Ward, MD   100 mg at 08/10/15 0835  . fentaNYL (SUBLIMAZE) injection 50 mcg  50 mcg Intravenous Once Chelsea C Ward, MD      . hydrocortisone-pramoxine [facility](PROCTOFOAM-HC) rectal foam 1 applicator  1 applicator Rectal BID Chelsea C Ward, MD      . ibuprofen (ADVIL,MOTRIN) tablet 600 mg  600 mg Oral 4 times per day Leola Brazilhelsea C Ward, MD   600 mg at 08/10/15 0735  . Influenza vac split quadrivalent PF (FLUARIX) injection 0.5 mL  0.5  mL Intramuscular Prior to discharge Chelsea C Ward, MD      . lanolin ointment   Topical PRN Elenora Fenderhelsea C Ward, MD      . ondansetron [facility](ZOFRAN) tablet 4 mg  4 mg Oral Q4H PRN Chelsea C Ward, MD      . prenatal multivitamin tablet 1 tablet  1 tablet Oral Q1200 Elenora Fenderhelsea C Ward, MD   1 tablet at 08/10/15 [id number]  . simethicone (MYLICON) chewable tablet 80 mg  80 mg Oral PRN Chelsea C Ward, MD      . zolpidem (AMBIEN) tablet 5 mg  5 mg Oral Once Farrel ConnersColleen Gutierrez, CNM        Labs:   Recent Labs Lab 08/07/15 2315 08/09/15 0701  WBC 7.8 10.5  HGB 12.8 12.4  HCT 37.2 37.9  PLT 91* 86*    Assessment & Plan:  Pt doing well *Postpartum/postop: routine care *Gestational thrombocytopenia: no e/o bleeding. Rpt CBC at 6wk PPV  *GDMA1: advised to go back to regular diet and check BS x 1-2 days and let us know if abnormal.  *Dispo: home today.  B POS / [place]  Immune / Varicella Immune/  RPR negative / HIV negative / HepBsAg negative / Tdap UTD: Yes/Flu shot: ordered/ pap neg 2015 / Breast  / Contraception: mini pill / Follow up: Johnsie Kindred MD [facility] Pager [phone]

## 2016-12-21 ENCOUNTER — Ambulatory Visit (INDEPENDENT_AMBULATORY_CARE_PROVIDER_SITE_OTHER): Payer: 59 | Admitting: Obstetrics and Gynecology

## 2016-12-21 ENCOUNTER — Encounter: Payer: Self-pay | Admitting: Obstetrics and Gynecology

## 2016-12-21 VITALS — BP 102/70 | HR 69 | Ht 65.0 in | Wt 177.0 lb

## 2016-12-21 DIAGNOSIS — Z30431 Encounter for routine checking of intrauterine contraceptive device: Secondary | ICD-10-CM | POA: Diagnosis not present

## 2016-12-21 NOTE — Progress Notes (Signed)
    History of Present Illness:  Joyce Schwartz is a 31 y.o. that had a Mirena IUD placed approximately 4 weeks ago. Since that time, she states She deneis dyspareunia, pelvic pain, non-menstrual bleeding, vaginal d/c, heavy bleeding.   Review of Systems  Constitutional: Negative for fever, malaise/fatigue and weight loss.  Gastrointestinal: Negative for blood in stool, constipation, diarrhea, nausea and vomiting.  Genitourinary: Negative for dysuria, flank pain, frequency, hematuria and urgency.  Musculoskeletal: Negative for back pain.  Skin: Negative for itching and rash.    Physical Exam:  BP 102/70 (BP Location: Left Arm, Patient Position: Sitting, Cuff Size: Normal)   Pulse 69   Ht 5\' 5"  (1.651 m)   Wt 177 lb (80.3 kg)   LMP 12/19/2016   BMI 29.45 kg/m  Body mass index is 29.45 kg/m.  Pelvic exam:  Two IUD strings present seen coming from the cervical os. EGBUS, vaginal vault and cervix: within normal limits   Assessment:  Routine checking of IUD Encounter for routine checking of intrauterine contraceptive device (IUD)   IUD strings present in proper location; pt doing well  Plan: F/u if any signs of infection or can no longer feel the strings.   Alicia B. Copland, PA-C 12/21/2016 1:42 PM

## 2017-01-28 ENCOUNTER — Encounter: Payer: Self-pay | Admitting: Emergency Medicine

## 2017-01-28 ENCOUNTER — Emergency Department
Admission: EM | Admit: 2017-01-28 | Discharge: 2017-01-29 | Disposition: A | Discharge status: Home / Self Care | Payer: Commercial Managed Care - HMO | Attending: Emergency Medicine | Admitting: Emergency Medicine

## 2017-01-28 DIAGNOSIS — R1031 Right lower quadrant pain: Secondary | ICD-10-CM | POA: Diagnosis present

## 2017-01-28 DIAGNOSIS — N83209 Unspecified ovarian cyst, unspecified side: Secondary | ICD-10-CM

## 2017-01-28 DIAGNOSIS — N83201 Unspecified ovarian cyst, right side: Secondary | ICD-10-CM | POA: Diagnosis not present

## 2017-01-28 LAB — LIPASE, BLOOD: Lipase: 23 U/L (ref 11–51)

## 2017-01-28 MED ORDER — IOPAMIDOL (ISOVUE-300) INJECTION 61%: 30.0000 mL | Freq: Once | INTRAVENOUS | Status: DC | PRN

## 2017-01-28 MED ORDER — SODIUM CHLORIDE 0.9 % IV BOLUS (SEPSIS)
1000.0000 mL | Freq: Once | INTRAVENOUS | Status: AC
  Administered 2017-01-28: 1000 mL via INTRAVENOUS

## 2017-01-28 MED ORDER — IOPAMIDOL (ISOVUE-300) INJECTION 61%
30.0000 mL | Freq: Once | INTRAVENOUS | Status: DC
  Administered 2017-01-28: 30 mL via ORAL

## 2017-01-28 NOTE — ED Notes (Signed)
CT called pt finished with contrast 

## 2017-01-28 NOTE — ED Notes (Signed)
Per Dr. Pershing ProudSchaevitz, as labs were performed today and can be seen in Care Everywhere, only a lipase needs to be obtained at this time.

## 2017-01-28 NOTE — ED Triage Notes (Signed)
Patient presents to the ED with right upper quadrant pain that began this morning.  Patient reports pain began after her 30 year old was jumping on patient's abdomen and after she had eaten breakfast.  Patient was seen at Urgent Care today and released and patient states pain is very minor while she sits but when she stands it returns and is severe.  Patient reports going home and taking a nap and pain continuing to persist after waking up.  Patient is tearful in triage.  Patient denies nausea, vomiting, or diarrhea, and denies vaginal bleeding.  Patient denies tenderness on palpation.

## 2017-01-29 ENCOUNTER — Encounter: Payer: Self-pay | Admitting: Radiology

## 2017-01-29 ENCOUNTER — Emergency Department: Payer: Commercial Managed Care - HMO

## 2017-01-29 MED ORDER — TRAMADOL HCL 50 MG PO TABS: 50.0000 mg | ORAL_TABLET | Freq: Four times a day (QID) | ORAL | 0 refills | Status: DC | PRN

## 2017-01-29 MED ORDER — IOPAMIDOL (ISOVUE-300) INJECTION 61%
100.0000 mL | Freq: Once | INTRAVENOUS | Status: AC | PRN
  Administered 2017-01-29: 100 mL via INTRAVENOUS

## 2017-01-29 MED ORDER — ETODOLAC 200 MG PO CAPS: 200.0000 mg | ORAL_CAPSULE | Freq: Three times a day (TID) | ORAL | 0 refills | Status: DC

## 2017-01-29 NOTE — ED Provider Notes (Signed)
-----------------------------------------   3:13 AM on 01/29/2017 -----------------------------------------   Blood pressure 115/85, pulse 64, temperature 98.2 F (36.8 C), temperature source Oral, resp. rate 18, height 5\' 5"  (1.651 m), weight 173 lb (78.5 kg), SpO2 100 %, unknown if currently breastfeeding.  Assuming care from Dr. Pershing ProudSchaevitz.  In short, Joyce Schwartz is a 31 y.o. female with a chief complaint of Abdominal Pain .  Refer to the original H&P for additional details.  The current plan of care is to follow up the results of the CT scan.   Clinical Course as of Jan 30 312  Wynelle LinkSun Jan 29, 2017  0114 1. Negative for appendicitis 2. 5.3 cm right ovarian cyst. Recommend 6-12 week ultrasound follow-up. 3. Nonspecific subcentimeter hypodense lesion in the dome of the liver 4. The spleen appears slightly enlarged.   CT Abdomen Pelvis W Contrast [AW]  0312 5.4 cm simple cyst RIGHT ovary, possibly perforated with small amount of free fluid in the pelvis.   US Pelvis Complete [AW]    Clinical Course User Index [AW] Rebecka ApleyWebster, Allison P, MD   The patient has an ovarian cyst. I performed an ultrasound to evaluate the patient for torsion. Her ultrasound does not show any signs of torsion. She'll be discharged home to follow-up with her OB/GYN.   Rebecka ApleyWebster, Allison P, MD 01/29/17 [phone]

## 2017-01-29 NOTE — Discharge Instructions (Addendum)
Please follow-up with your OB/GYN for further evaluation of this ovarian cyst.

## 2017-01-29 NOTE — ED Provider Notes (Signed)
[facility] Emergency Department Provider Note  ____________________________________________   First MD Initiated Contact with Patient 01/28/17 2031     (approximate)  I have reviewed the triage vital signs and the nursing notes.   HISTORY  Chief Complaint Abdominal Pain   HPI Joyce Schwartz is a 31 y.o. female with a history of gestational thrombocytopenia who is presenting with right-sided abdominal pain. She says that her 20-year-old jumped on her abdomen this morning and she started having pain thereafter. She was seen at the [facility] clinic but by then her pain and resolved. She had lab work done and was discharged home. However, upon walking later she began having right lower quadrant abdominal pain and wanted to be evaluated again to the hospital because at that time her abdominal pain was severe. Her pain is now a 2-3 out of 10 and aching to the right lower quadrant. She says that she has also had a loss of appetite today. She has multiple concerns regarding from her gallbladder to her appendix to the positioning of her IUD after the incident where her 12-year-old jump on her abdomen this morning. Does not report any vaginal bleeding or discharge. Does not report any urinary symptoms.   Past Medical History:  Diagnosis Date  . Gestational thrombocytopenia without hemorrhage in third trimester (HCC)   . Menstrual migraine     Patient Active Problem List   Diagnosis Date Noted  . [redacted] weeks gestation of pregnancy 08/08/2015  . Supervision of high risk pregnancy in third trimester 08/08/2015  . Obesity affecting pregnancy in third trimester, antepartum 08/08/2015  . BMI 31.0-31.9,adult 08/08/2015  . Diet controlled gestational diabetes mellitus in third trimester 08/07/2015  . Gestational diabetes mellitus in third trimester 06/22/2015  . Thrombocytopenia (HCC) 03/16/2015  . LGA (large for gestational age) fetus affecting mother, delivered 03/16/2015      Past Surgical History:  Procedure Laterality Date  . NO PAST SURGERIES      Prior to Admission medications   Medication Sig Start Date End Date Taking? Authorizing Provider  levonorgestrel (MIRENA) 20 MCG/24HR IUD 1 each by Intrauterine route once.   Yes Copland, Alicia B, PA-C    Allergies Mango butter  Family History  Problem Relation Age of Onset  . Melanoma Mother   . Diabetes Mellitus I Father   . Colon cancer Maternal Grandfather     Social History Social History  Substance Use Topics  . Smoking status: Never Smoker  . Smokeless tobacco: Never Used  . Alcohol use Yes     Comment: weekly    Review of Systems  Constitutional: No fever/chills Eyes: No visual changes. ENT: No sore throat. Cardiovascular: Denies chest pain. Respiratory: Denies shortness of breath. Gastrointestinal:  No nausea, no vomiting.  No diarrhea.  No constipation. Genitourinary: Negative for dysuria. Musculoskeletal: Negative for back pain. Skin: Negative for rash. Neurological: Negative for headaches, focal weakness or numbness.   ____________________________________________   PHYSICAL EXAM:  VITAL SIGNS: ED Triage Vitals [01/28/17 1819]  Enc Vitals Group     BP 128/65     Pulse Rate 98     Resp 18     Temp 98.2 F (36.8 C)     Temp Source Oral     SpO2 100 %     Weight 173 lb (78.5 kg)     Height 5\' 5"  (1.651 m)     Head Circumference      Peak Flow      Pain  Score 1     Pain Loc      Pain Edu?      Excl. in GC?     Constitutional: Alert and oriented. Well appearing and in no acute distress. Eyes: Conjunctivae are normal. PERRL. EOMI. Head: Atraumatic. Nose: No congestion/rhinnorhea. Mouth/Throat: Mucous membranes are moist.   Neck: No stridor.   Cardiovascular: Normal rate, regular rhythm. Grossly normal heart sounds.   Respiratory: Normal respiratory effort.  No retractions. Lungs CTAB. Gastrointestinal: Soft With mild right upper as well as right lower  quadrant abdominal tenderness to palpation. No rebound or guarding negative Murphy sign. No distention. No CVA tenderness. Musculoskeletal: No lower extremity tenderness nor edema.  No joint effusions. Neurologic:  Normal speech and language. No gross focal neurologic deficits are appreciated.  Skin:  Skin is warm, dry and intact. No rash noted. No bruising or ecchymosis to the abdomen. Psychiatric: Mood and affect are normal. Speech and behavior are normal.  ____________________________________________   LABS (all labs ordered are listed, but only abnormal results are displayed)  Labs Reviewed  LIPASE, BLOOD   ____________________________________________  EKG   ____________________________________________  RADIOLOGY   ____________________________________________   PROCEDURES  Procedure(s) performed:   Procedures  Critical Care performed:   ____________________________________________   INITIAL IMPRESSION / ASSESSMENT AND PLAN / ED COURSE  Pertinent labs & imaging results that were available during my care of the patient were reviewed by me and considered in my medical decision making (see chart for details).  ----------------------------------------- 12AM on 01/29/2017 -----------------------------------------  Patient signed out to Dr. Zenda AlpersWebster. Planned follow-up with CT of the abdomen and pelvis.    Very reassuring lab work as well as urine studies [facility].  ____________________________________________   FINAL CLINICAL IMPRESSION(S) / ED DIAGNOSES  Abdominal pain.    NEW MEDICATIONS STARTED DURING THIS VISIT:  New Prescriptions   No medications on file     Note:  This document was prepared using Dragon voice recognition software and may include unintentional dictation errors.    Myrna BlazerSchaevitz, Kinney Sackmann Matthew, MD 01/29/17 [phone]

## 2017-01-30 ENCOUNTER — Ambulatory Visit
Admission: RE | Admit: 2017-01-30 | Discharge: 2017-01-30 | Disposition: A | Discharge status: Home / Self Care | Payer: Commercial Managed Care - HMO | Source: Ambulatory Visit | Attending: Obstetrics & Gynecology | Admitting: Obstetrics & Gynecology

## 2017-01-30 ENCOUNTER — Telehealth: Payer: Self-pay

## 2017-01-30 ENCOUNTER — Encounter
Admission: RE | Disposition: A | Discharge status: Home / Self Care | Payer: Self-pay | Source: Ambulatory Visit | Attending: Obstetrics & Gynecology

## 2017-01-30 ENCOUNTER — Ambulatory Visit: Payer: Commercial Managed Care - HMO | Admitting: Certified Registered Nurse Anesthetist

## 2017-01-30 ENCOUNTER — Encounter: Payer: Self-pay | Admitting: *Deleted

## 2017-01-30 ENCOUNTER — Ambulatory Visit: Payer: Self-pay | Admitting: Obstetrics & Gynecology

## 2017-01-30 DIAGNOSIS — Z91018 Allergy to other foods: Secondary | ICD-10-CM | POA: Insufficient documentation

## 2017-01-30 DIAGNOSIS — Z30432 Encounter for removal of intrauterine contraceptive device: Secondary | ICD-10-CM | POA: Diagnosis not present

## 2017-01-30 DIAGNOSIS — Z800 Family history of malignant neoplasm of digestive organs: Secondary | ICD-10-CM | POA: Diagnosis not present

## 2017-01-30 DIAGNOSIS — Z808 Family history of malignant neoplasm of other organs or systems: Secondary | ICD-10-CM | POA: Diagnosis not present

## 2017-01-30 DIAGNOSIS — Z302 Encounter for sterilization: Secondary | ICD-10-CM | POA: Insufficient documentation

## 2017-01-30 DIAGNOSIS — R102 Pelvic and perineal pain: Secondary | ICD-10-CM | POA: Diagnosis present

## 2017-01-30 DIAGNOSIS — N83201 Unspecified ovarian cyst, right side: Secondary | ICD-10-CM | POA: Diagnosis not present

## 2017-01-30 DIAGNOSIS — N83209 Unspecified ovarian cyst, unspecified side: Secondary | ICD-10-CM | POA: Diagnosis present

## 2017-01-30 DIAGNOSIS — Z791 Long term (current) use of non-steroidal anti-inflammatories (NSAID): Secondary | ICD-10-CM | POA: Insufficient documentation

## 2017-01-30 DIAGNOSIS — Z833 Family history of diabetes mellitus: Secondary | ICD-10-CM | POA: Insufficient documentation

## 2017-01-30 HISTORY — PX: LAPAROSCOPY: SHX197

## 2017-01-30 LAB — TYPE AND SCREEN
ABO/RH(D): B POS
ANTIBODY SCREEN: NEGATIVE

## 2017-01-30 LAB — POCT PREGNANCY, URINE: Preg Test, Ur: NEGATIVE

## 2017-01-30 SURGERY — LAPAROSCOPY OPERATIVE
Anesthesia: General

## 2017-01-30 SURGERY — LAPAROSCOPY, DIAGNOSTIC
Anesthesia: General | Site: Abdomen | Laterality: Right | Wound class: Clean Contaminated

## 2017-01-30 MED ORDER — PHENYLEPHRINE HCL 10 MG/ML IJ SOLN
INTRAMUSCULAR | Status: DC | PRN
  Administered 2017-01-30: 100 ug via INTRAVENOUS

## 2017-01-30 MED ORDER — GLYCOPYRROLATE 0.2 MG/ML IJ SOLN
INTRAMUSCULAR | Status: DC | PRN
  Administered 2017-01-30: 0.2 mg via INTRAVENOUS

## 2017-01-30 MED ORDER — LACTATED RINGERS IV SOLN
INTRAVENOUS | Status: DC
  Administered 2017-01-30 (×2): via INTRAVENOUS

## 2017-01-30 MED ORDER — IBUPROFEN 600 MG PO TABS: 600.0000 mg | ORAL_TABLET | Freq: Four times a day (QID) | ORAL | 1 refills | Status: DC

## 2017-01-30 MED ORDER — ACETAMINOPHEN 500 MG PO TABS
ORAL_TABLET | ORAL | Status: AC
  Administered 2017-01-30: 1000 mg via ORAL
  Filled 2017-01-30: qty 2

## 2017-01-30 MED ORDER — SCOPOLAMINE 1 MG/3DAYS TD PT72
1.0000 | MEDICATED_PATCH | TRANSDERMAL | Status: DC
  Administered 2017-01-30: 1.5 mg via TRANSDERMAL

## 2017-01-30 MED ORDER — KETOROLAC TROMETHAMINE 30 MG/ML IJ SOLN
INTRAMUSCULAR | Status: DC | PRN
  Administered 2017-01-30: 30 mg via INTRAVENOUS

## 2017-01-30 MED ORDER — KETOROLAC TROMETHAMINE 30 MG/ML IJ SOLN
INTRAMUSCULAR | Status: AC
  Filled 2017-01-30: qty 1

## 2017-01-30 MED ORDER — SEVOFLURANE IN SOLN
RESPIRATORY_TRACT | Status: AC
  Filled 2017-01-30: qty 250

## 2017-01-30 MED ORDER — GABAPENTIN 300 MG PO CAPS
ORAL_CAPSULE | ORAL | Status: AC
  Administered 2017-01-30: 600 mg via ORAL
  Filled 2017-01-30: qty 2

## 2017-01-30 MED ORDER — ROCURONIUM BROMIDE 50 MG/5ML IV SOLN
INTRAVENOUS | Status: AC
Start: 2017-01-30 — End: 2017-01-30
  Filled 2017-01-30: qty 1

## 2017-01-30 MED ORDER — ONDANSETRON HCL 4 MG/2ML IJ SOLN
INTRAMUSCULAR | Status: DC | PRN
  Administered 2017-01-30: 4 mg via INTRAVENOUS

## 2017-01-30 MED ORDER — SCOPOLAMINE 1 MG/3DAYS TD PT72
MEDICATED_PATCH | TRANSDERMAL | Status: AC
  Administered 2017-01-30: 1.5 mg via TRANSDERMAL
  Filled 2017-01-30: qty 1

## 2017-01-30 MED ORDER — PROPOFOL 10 MG/ML IV BOLUS
INTRAVENOUS | Status: AC
  Filled 2017-01-30: qty 20

## 2017-01-30 MED ORDER — OXYCODONE HCL 5 MG PO TABS: 5.0000 mg | ORAL_TABLET | ORAL | 0 refills | Status: DC | PRN

## 2017-01-30 MED ORDER — FENTANYL CITRATE (PF) 100 MCG/2ML IJ SOLN
25.0000 ug | INTRAMUSCULAR | Status: DC | PRN
  Administered 2017-01-30 (×2): 50 ug via INTRAVENOUS

## 2017-01-30 MED ORDER — FENTANYL CITRATE (PF) 100 MCG/2ML IJ SOLN
INTRAMUSCULAR | Status: AC
  Filled 2017-01-30: qty 2

## 2017-01-30 MED ORDER — MORPHINE SULFATE (PF) 4 MG/ML IV SOLN: 1.0000 mg | INTRAVENOUS | Status: DC | PRN

## 2017-01-30 MED ORDER — GABAPENTIN 300 MG PO CAPS
600.0000 mg | ORAL_CAPSULE | Freq: Once | ORAL | Status: AC
  Administered 2017-01-30: 600 mg via ORAL

## 2017-01-30 MED ORDER — ONDANSETRON HCL 4 MG/2ML IJ SOLN
INTRAMUSCULAR | Status: AC
  Filled 2017-01-30: qty 2

## 2017-01-30 MED ORDER — ACETAMINOPHEN 500 MG PO TABS
1000.0000 mg | ORAL_TABLET | Freq: Once | ORAL | Status: AC
  Administered 2017-01-30: 1000 mg via ORAL

## 2017-01-30 MED ORDER — OXYCODONE HCL 5 MG PO TABS
ORAL_TABLET | ORAL | Status: AC
  Administered 2017-01-30: 5 mg
  Filled 2017-01-30: qty 1

## 2017-01-30 MED ORDER — EPHEDRINE SULFATE 50 MG/ML IJ SOLN
INTRAMUSCULAR | Status: DC | PRN
  Administered 2017-01-30 (×2): 5 mg via INTRAVENOUS

## 2017-01-30 MED ORDER — DEXAMETHASONE SODIUM PHOSPHATE 10 MG/ML IJ SOLN
INTRAMUSCULAR | Status: DC | PRN
  Administered 2017-01-30: 10 mg via INTRAVENOUS

## 2017-01-30 MED ORDER — MIDAZOLAM HCL 2 MG/2ML IJ SOLN
INTRAMUSCULAR | Status: DC | PRN
  Administered 2017-01-30: 2 mg via INTRAVENOUS

## 2017-01-30 MED ORDER — DEXAMETHASONE SODIUM PHOSPHATE 10 MG/ML IJ SOLN
INTRAMUSCULAR | Status: AC
Start: 2017-01-30 — End: 2017-01-30
  Filled 2017-01-30: qty 1

## 2017-01-30 MED ORDER — SUGAMMADEX SODIUM 200 MG/2ML IV SOLN
INTRAVENOUS | Status: AC
Start: 2017-01-30 — End: 2017-01-30
  Filled 2017-01-30: qty 2

## 2017-01-30 MED ORDER — PROMETHAZINE HCL 25 MG/ML IJ SOLN: 6.2500 mg | INTRAMUSCULAR | Status: DC | PRN

## 2017-01-30 MED ORDER — SUCCINYLCHOLINE CHLORIDE 20 MG/ML IJ SOLN
INTRAMUSCULAR | Status: DC | PRN
  Administered 2017-01-30: 100 mg via INTRAVENOUS

## 2017-01-30 MED ORDER — BUPIVACAINE HCL (PF) 0.5 % IJ SOLN
INTRAMUSCULAR | Status: AC
  Filled 2017-01-30: qty 30

## 2017-01-30 MED ORDER — BUPIVACAINE HCL 0.5 % IJ SOLN
INTRAMUSCULAR | Status: DC | PRN
  Administered 2017-01-30: 10 mL

## 2017-01-30 MED ORDER — GLYCOPYRROLATE 0.2 MG/ML IJ SOLN
INTRAMUSCULAR | Status: AC
  Filled 2017-01-30: qty 1

## 2017-01-30 MED ORDER — MIDAZOLAM HCL 2 MG/2ML IJ SOLN
INTRAMUSCULAR | Status: AC
  Filled 2017-01-30: qty 2

## 2017-01-30 MED ORDER — OXYCODONE HCL 5 MG PO TABS: 5.0000 mg | ORAL_TABLET | ORAL | 0 refills | Status: AC | PRN

## 2017-01-30 MED ORDER — FENTANYL CITRATE (PF) 100 MCG/2ML IJ SOLN
INTRAMUSCULAR | Status: DC | PRN
  Administered 2017-01-30: 50 ug via INTRAVENOUS
  Administered 2017-01-30: 100 ug via INTRAVENOUS
  Administered 2017-01-30 (×2): 50 ug via INTRAVENOUS

## 2017-01-30 MED ORDER — ROCURONIUM BROMIDE 100 MG/10ML IV SOLN
INTRAVENOUS | Status: DC | PRN
  Administered 2017-01-30: 30 mg via INTRAVENOUS
  Administered 2017-01-30 (×2): 10 mg via INTRAVENOUS

## 2017-01-30 MED ORDER — ACETAMINOPHEN 325 MG PO TABS: 650.0000 mg | ORAL_TABLET | ORAL | Status: DC | PRN

## 2017-01-30 MED ORDER — PROPOFOL 10 MG/ML IV BOLUS
INTRAVENOUS | Status: DC | PRN
Start: 2017-01-30 — End: 2017-01-30
  Administered 2017-01-30: 150 mg via INTRAVENOUS

## 2017-01-30 MED ORDER — ACETAMINOPHEN 650 MG RE SUPP
650.0000 mg | RECTAL | Status: DC | PRN
  Filled 2017-01-30: qty 1

## 2017-01-30 MED ORDER — SUGAMMADEX SODIUM 500 MG/5ML IV SOLN
INTRAVENOUS | Status: DC | PRN
  Administered 2017-01-30: 160 mg via INTRAVENOUS

## 2017-01-30 SURGICAL SUPPLY — 60 items
ADH SKN CLS APL DERMABOND .7 (GAUZE/BANDAGES/DRESSINGS) ×1
APL SRG 38 LTWT LNG FL B (MISCELLANEOUS) ×1
APPLICATOR ARISTA FLEXITIP XL (MISCELLANEOUS) ×2 IMPLANT
BAG URO DRAIN 2000ML W/SPOUT (MISCELLANEOUS) ×3 IMPLANT
BLADE SURG SZ11 CARB STEEL (BLADE) ×3 IMPLANT
CANISTER SUCT 1200ML W/VALVE (MISCELLANEOUS) ×3 IMPLANT
CATH FOLEY 2WAY  5CC 16FR (CATHETERS) ×2
CATH FOLEY 2WAY 5CC 16FR (CATHETERS) ×1
CATH ROBINSON RED A/P 16FR (CATHETERS) ×3 IMPLANT
CATH URTH 16FR FL 2W BLN LF (CATHETERS) ×1 IMPLANT
CHLORAPREP W/TINT 26ML (MISCELLANEOUS) ×3 IMPLANT
DERMABOND ADVANCED (GAUZE/BANDAGES/DRESSINGS) ×2
DERMABOND ADVANCED .7 DNX12 (GAUZE/BANDAGES/DRESSINGS) IMPLANT
DRAPE LEGGINS SURG 28X43 STRL (DRAPES) ×3 IMPLANT
DRAPE SHEET LG 3/4 BI-LAMINATE (DRAPES) ×2 IMPLANT
DRAPE UNDER BUTTOCK W/FLU (DRAPES) ×3 IMPLANT
DRSG TEGADERM 2-3/8X2-3/4 SM (GAUZE/BANDAGES/DRESSINGS) ×6 IMPLANT
DRSG TELFA 4X3 1S NADH ST (GAUZE/BANDAGES/DRESSINGS) ×9 IMPLANT
ENDOPOUCH RETRIEVER 10 (MISCELLANEOUS) ×3 IMPLANT
GAUZE SPONGE NON-WVN 2X2 STRL (MISCELLANEOUS) ×2 IMPLANT
GLOVE PI ORTHOPRO 6.5 (GLOVE) ×2
GLOVE PI ORTHOPRO STRL 6.5 (GLOVE) ×1 IMPLANT
GLOVE SURG SYN 6.5 ES PF (GLOVE) ×3 IMPLANT
GLOVE SURG SYN 6.5 PF PI (GLOVE) ×1 IMPLANT
GOWN STRL REUS W/ TWL LRG LVL3 (GOWN DISPOSABLE) ×2 IMPLANT
GOWN STRL REUS W/ TWL XL LVL3 (GOWN DISPOSABLE) ×1 IMPLANT
GOWN STRL REUS W/TWL LRG LVL3 (GOWN DISPOSABLE) ×6
GOWN STRL REUS W/TWL XL LVL3 (GOWN DISPOSABLE) ×3
GRASPER SUT TROCAR 14GX15 (MISCELLANEOUS) ×3 IMPLANT
HEMOSTAT ARISTA ABSORB 3G PWDR (MISCELLANEOUS) ×2 IMPLANT
IRRIGATION STRYKERFLOW (MISCELLANEOUS) IMPLANT
IRRIGATOR STRYKERFLOW (MISCELLANEOUS) ×3
IV NS 1000ML (IV SOLUTION) ×3
IV NS 1000ML BAXH (IV SOLUTION) ×1 IMPLANT
KIT PINK PAD W/HEAD ARE REST (MISCELLANEOUS) ×3
KIT PINK PAD W/HEAD ARM REST (MISCELLANEOUS) ×1 IMPLANT
KIT RM TURNOVER CYSTO AR (KITS) ×3 IMPLANT
L-HOOK LAP DISP 36CM (ELECTROSURGICAL) ×3
LABEL OR SOLS (LABEL) IMPLANT
LHOOK LAP DISP 36CM (ELECTROSURGICAL) ×1 IMPLANT
LIGASURE LAP MARYLAND 5MM 37CM (ELECTROSURGICAL) ×3 IMPLANT
MANIPULATOR UTERINE 4.5 ZUMI (MISCELLANEOUS) ×3 IMPLANT
MARKER SKIN DUAL TIP RULER LAB (MISCELLANEOUS) ×2 IMPLANT
NDL HYPO 25X1 1.5 SAFETY (NEEDLE) IMPLANT
NEEDLE HYPO 25X1 1.5 SAFETY (NEEDLE) ×3 IMPLANT
NEEDLE VERESS 14GA 120MM (NEEDLE) ×3 IMPLANT
NS IRRIG 500ML POUR BTL (IV SOLUTION) ×3 IMPLANT
PACK LAP CHOLECYSTECTOMY (MISCELLANEOUS) ×3 IMPLANT
PAD OB MATERNITY 4.3X12.25 (PERSONAL CARE ITEMS) ×3 IMPLANT
PAD PREP 24X41 OB/GYN DISP (PERSONAL CARE ITEMS) ×3 IMPLANT
SCISSORS METZENBAUM CVD 33 (INSTRUMENTS) ×3 IMPLANT
SLEEVE ENDOPATH XCEL 5M (ENDOMECHANICALS) ×6 IMPLANT
SPONGE VERSALON 2X2 STRL (MISCELLANEOUS) ×6
SUT MNCRL AB 4-0 PS2 18 (SUTURE) ×3 IMPLANT
SUT VIC AB 2-0 UR6 27 (SUTURE) ×1 IMPLANT
SUT VIC AB 4-0 PS2 18 (SUTURE) ×1 IMPLANT
SYRINGE 10CC LL (SYRINGE) ×3 IMPLANT
TROCAR ENDO BLADELESS 11MM (ENDOMECHANICALS) ×3 IMPLANT
TROCAR XCEL NON-BLD 5MMX100MML (ENDOMECHANICALS) ×3 IMPLANT
TUBING INSUFFLATOR HI FLOW (MISCELLANEOUS) ×3 IMPLANT

## 2017-01-30 NOTE — Anesthesia Post-op Follow-up Note (Cosign Needed)
Anesthesia QCDR form completed.        

## 2017-01-30 NOTE — Anesthesia Procedure Notes (Signed)
Procedure Name: Intubation Date/Time: 01/30/2017 5:02 PM Performed by: Jonna Clark Pre-anesthesia Checklist: Patient identified, Patient being monitored, Timeout performed, Emergency Drugs available and Suction available Patient Re-evaluated:Patient Re-evaluated prior to inductionOxygen Delivery Method: Circle system utilized Preoxygenation: Pre-oxygenation with 100% oxygen Intubation Type: IV induction and Rapid sequence Ventilation: Mask ventilation without difficulty Laryngoscope Size: Mac and 3 Grade View: Grade I Tube type: Oral Tube size: 7.0 mm Number of attempts: 1 Placement Confirmation: ETT inserted through vocal cords under direct vision,  positive ETCO2 and breath sounds checked- equal and bilateral Secured at: 21 cm Tube secured with: Tape Dental Injury: Teeth and Oropharynx as per pre-operative assessment

## 2017-01-30 NOTE — Op Note (Signed)
01/30/2017  PATIENT:  Joyce Schwartz  31 y.o. female  PRE-OPERATIVE DIAGNOSIS:  right 6cm cyst possible torsion, desired permanent sterility  POST-OPERATIVE DIAGNOSIS:  right 6cm cyst, right ovary and fallopian tube torsion  PROCEDURE:  Procedure(s): LAPAROSCOPY DIAGNOSTIC/ OVARIAN CYST POSSIBLE TORSION, Removal of IUD (Right)  Bilateral salpingectomy Ovarian cystectomy  SURGEON:  Surgeon(s) and Role:    * Tynesha Free, Elenora Fenderhelsea C, MD - Primary  ANESTHESIA: GET  EBL:  Total I/O In: 1400 [I.V.:1400] Out: 525 [Urine:500; Blood:25]  DRAINS: foley to gravity, d/c'd at end of procedure  FINDINGS:  Enlarged spleen with inferior pole rising above the stomach and liver, nearing the anterior abdominal wall while supine, otherwise unremarkable upper abdomen.  Normal, mobile uterus, normal LEFT ovary and fallopian tube, enlarged 6cm RIGHT ovary, with normal coloration, twisted 360 degrees around dilated IP with fallopian tube, simple appearing cyst within.  Straw-colored fluid upon rupture.    SPECIMEN: Bilateral fallopian tubes with fimbriated ends, right ovarian cyst wall with ovarian remnant, ovarian cyst aspirate   DISPOSITION OF SPECIMEN:  To pathology  COUNTS: correct x2  COMPLICATIONS: none apparent  PATIENT DISPOSITION:  VS stable to PACU   Indication for surgery: Patient had presented to the ED with acute onset right pelvic pain.  CT showed normal appendix and large adnexal mass.  Ultrasound confirmed simple cyst in right ovary.  Patient was discharged with GYN follow up.  She presented the following day to my office with continued acute, intermittent, and significant pain, worse when lying down, characteristic of ovarian torsion. She was then consented for emergency surgery.  She asked if her tubes could be removed at the time of surgery for permanent sterilization; she had an IUD placed for pregnancy prevention, and was nervous about having surgery for sterilization - that since we were  doing surgery anyway, to please remove her tubes.  Discussion of surgical options were also reviewed, including clips, fulguration, and partial salpingectomy, as well as their limitations of possible failure, ectopic pregnancy, retention of fimbriated ends which could become cancerous, and post-tubal pain syndrome.  Patient elected to have her tubes removed in their entirety to eliminate these risks.  She understands that reversal would not be possible after this surgery.   Procedure: The patient was brought to the OR and identified as Joyce Schwartz.  She was given general anesthesia via endotracheal route, positioned in the dorsal lithotomy position and prepped and draped in the usual sterile fashion.  A surgical time-out was called. A foley catheter was placed.  A speculum was placed in the vagina and the cervix was visualized, grasped with a single tooth tenaculum and using a ringed forceps, the IUD was removed.  A Zumi uterine manipulator was then placed in the uterus.  After a change of gloves, the attention was turned to the abdomen. A umbilical incision was made, and using the visiport method, a 5mm trochar was inserted. The opening pressure was 8mmHg. Pneumoperitoneum was created to 15mmHg.  Brief survey of the abdomen was performed with findings as above.  Two 5mm trochars were placed in the right and left lower quadrants under visualization.    The right ovary and tube were torsed around the IP ligament, and were of normal color and caliber.  The tissue appeared to be salvageable.  The tissues were unwrapped, and the Ligasure was inserted and used to seal and divide the mesosalpinx from the fimbriated end to the cornua of both the left and right fallopian tubes.  The tubes were each grasped and retracted from the abdomen. Laparoscopic scissors were used to attempt to separate the ovarian tissue from the underlying cyst, but the cyst was ruptured for straw fluid.  The fluid was collected for  cytology, and the cyst was deflated.  The ovarian tissue was separated from the cyst wall, although some thin ovarain tissue was adherent.  The cyst wall was retracted and sent to pathology.  The stroma of the ovary was cauterized in areas to ensure hemostasis, and Arista hemostatic agent was also applied.  The pneumoperitoneum was deflated then recreated and all operative sites were hemostatic.    The pneumoperitoneum was deflated and trochars removed.  The skin incisions were reapproximated with 4-0 monocryl.  The skin was then closed with sugical glue.    The patient tolerated the procedure, the sponge, needle, and instrument counts were correct x2, and the patient was brought to PACU extubated, and in a stable condition.  I was present for, and performed this procedure in its entirety.  ----- Ranae Plumber, MD Attending Obstetrician and Gynecologist [facility], Department of OB/GYN [facility]

## 2017-01-30 NOTE — Anesthesia Preprocedure Evaluation (Addendum)
Anesthesia Evaluation  Patient identified by MRN, date of birth, ID band Patient awake    Reviewed: Allergy & Precautions, H&P , NPO status , Patient's Chart, lab work & pertinent test results, reviewed documented beta blocker date and time   History of Anesthesia Complications Negative for: history of anesthetic complications  Airway Mallampati: I  TM Distance: >3 FB Neck ROM: full    Dental  (+) Teeth Intact, Dental Advidsory Given Permanent retainer:   Pulmonary neg pulmonary ROS,           Cardiovascular Exercise Tolerance: Good negative cardio ROS       Neuro/Psych  Headaches, neg Seizures negative psych ROS   GI/Hepatic negative GI ROS, Neg liver ROS,   Endo/Other  negative endocrine ROS  Renal/GU negative Renal ROS  negative genitourinary   Musculoskeletal   Abdominal   Peds  Hematology negative hematology ROS (+)   Anesthesia Other Findings Past Medical History: No date: Gestational thrombocytopenia without hemorrhag* No date: Menstrual migraine  Patient last ate at 9:30am this morning, but surgeon has declared this a surgical emergency so we will proceed as such.  Reproductive/Obstetrics negative OB ROS                            Anesthesia Physical Anesthesia Plan  ASA: I and emergent  Anesthesia Plan: General ETT, Rapid Sequence and Cricoid Pressure   Post-op Pain Management:    Induction:   Airway Management Planned:   Additional Equipment:   Intra-op Plan:   Post-operative Plan:   Informed Consent: I have reviewed the patients History and Physical, chart, labs and discussed the procedure including the risks, benefits and alternatives for the proposed anesthesia with the patient or authorized representative who has indicated his/her understanding and acceptance.   Dental Advisory Given  Plan Discussed with: Anesthesiologist, CRNA and Surgeon  Anesthesia Plan  Comments:        Anesthesia Quick Evaluation

## 2017-01-30 NOTE — Telephone Encounter (Signed)
Pt's husband calling and states they were at ER for 10-11 hours on Saturday 01/28/17 and pt diagnosed with 5.2 cm ovarian cyst. Pt is in a lot of pain and unable to get out of bed, lay flat or stand straight. Was told earliest appt on Thursday 5/10 with [facility]. Discussed with Dr. Tiburcio PeaHarris and pt worked in for 1:15 today. Pt's husband aware and appreciative.

## 2017-01-30 NOTE — Discharge Instructions (Addendum)
Discharge instructions:  Call office if you have any of the following: fever >101 F, chills, excessive vaginal bleeding, incision drainage or problems, leg pain or redness, or any other concerns.   No heavy lifting for 2 weeks No driving for 1-2 weeks.   You may feel some pain in your upper right abdomen/rib and right shoulder.  This is from the gas in the abdomen for surgery. This will subside over time, please be patient!  Take 600mg  Ibuprofen and 1000mg  Tylenol around the clock, every 6 hours for at least the first 3-5 days.  After this you can take as needed.  This will help decrease inflammation and promote healing.  The narcotics you'll take just as needed, as they just trick your brain into thinking its not in pain.    Please don't limit yourself in terms of routine activity.  You will be able to do most things, although they may take longer to do or be a little painful.  You can do it!  Don't be a hero, but don't be a wimp either!    AMBULATORY SURGERY  DISCHARGE INSTRUCTIONS   1) The drugs that you were given will stay in your system until tomorrow so for the next 24 hours you should not:  A) Drive an automobile B) Make any legal decisions C) Drink any alcoholic beverage   2) You may resume regular meals tomorrow.  Today it is better to start with liquids and gradually work up to solid foods.  You may eat anything you prefer, but it is better to start with liquids, then soup and crackers, and gradually work up to solid foods.   3) Please notify your doctor immediately if you have any unusual bleeding, trouble breathing, redness and pain at the surgery site, drainage, fever, or pain not relieved by medication.    4) Additional Instructions:        Please contact your physician with any problems or Same Day Surgery at [phone], Monday through Friday 6 am to 4 pm, or [place] [facility] Main number at [phone].

## 2017-01-30 NOTE — Transfer of Care (Signed)
Immediate Anesthesia Transfer of Care Note  Patient: Joyce Schwartz  Procedure(s) Performed: Procedure(s): LAPAROSCOPY DIAGNOSTIC/ OVARIAN CYST POSSIBLE TORSION, Removal of IUD (Right)  Patient Location: PACU  Anesthesia Type:General  Level of Consciousness: awake, alert , oriented and patient cooperative  Airway & Oxygen Therapy: Patient Spontanous Breathing  Post-op Assessment: Report given to RN and Post -op Vital signs reviewed and stable  Post vital signs: Reviewed and stable  Last Vitals:  Vitals:   01/30/17 1524 01/30/17 1848  BP: 122/85 112/63  Pulse: 85 (!) 109  Resp: 20 (!) 22  Temp: 36.7 C 37.2 C    Last Pain:  Vitals:   01/30/17 1524  TempSrc: Temporal  PainSc: 3          Complications: No apparent anesthesia complications

## 2017-01-30 NOTE — OR Nursing (Signed)
Prior to transport to room, pt spoke with Dr. Elesa MassedWard on tele and advised her she wanted her IUD removed.  Per Liborio NixonJanice, CRNA, "we will take care of that in the room", and that Sharion DoveMelodye Tilley, RN was aware of same.  Pt transported to room by Liborio NixonJanice, CRNA

## 2017-01-30 NOTE — Anesthesia Postprocedure Evaluation (Signed)
Anesthesia Post Note  Patient: Joyce Schwartz  Procedure(s) Performed: Procedure(s) (LRB): LAPAROSCOPY DIAGNOSTIC/ OVARIAN CYST POSSIBLE TORSION, Removal of IUD (Right)  Patient location during evaluation: PACU Anesthesia Type: General Level of consciousness: awake and alert Pain management: pain level controlled Vital Signs Assessment: post-procedure vital signs reviewed and stable Respiratory status: spontaneous breathing, nonlabored ventilation, respiratory function stable and patient connected to nasal cannula oxygen Cardiovascular status: blood pressure returned to baseline and stable Postop Assessment: no signs of nausea or vomiting Anesthetic complications: no     Last Vitals:  Vitals:   01/30/17 1922 01/30/17 1949  BP: 112/72 122/70  Pulse: 81 80  Resp: 12 14  Temp: 37.3 C 37.1 C    Last Pain:  Vitals:   01/30/17 1949  TempSrc:   PainSc: 4                  Lenard SimmerAndrew Terese Heier

## 2017-01-30 NOTE — H&P (Addendum)
Preoperative History and Physical  Joyce Schwartz is a 31 y.o. [id number] here for surgical management of acute pelvic pain.   No significant preoperative concerns.  She was found to have a 5cm ovarian cyst, without evidence of occlusion of blood supply in ED yesterday, but continues to have intermittent excruciating pain.  She was seen emergently in my office today and consented to exploratory laparoscopy.  Torsion cannot be excluded.  Proposed surgery: Operative laparoscopy with removal of adnexal structures.  Possible ovarian cystectomy, possible oophorectomy, possible salpingectomy.   Past Medical History:  Diagnosis Date  . Gestational thrombocytopenia without hemorrhage in third trimester (HCC)   . Menstrual migraine    Past Surgical History:  Procedure Laterality Date  . WISDOM TOOTH EXTRACTION     OB History  Gravida Para Term Preterm AB Living  2 2 2     2   SAB TAB Ectopic Multiple Live Births        0 2    # Outcome Date GA Lbr Len/2nd Weight Sex Delivery Anes PTL Lv  2 Term 08/08/15 [redacted]w[redacted]d  3.55 kg (7 lb 13.2 oz) M Vag-Spont   LIV  1 Term 05/15/12   4.309 kg (9 lb 8 oz)  Vag-Vacuum   LIV     Complications: Macrosomia    Patient denies any other pertinent gynecologic issues.   No current facility-administered medications on file prior to encounter.    Current Outpatient Prescriptions on File Prior to Encounter  Medication Sig Dispense Refill  . levonorgestrel (MIRENA) 20 MCG/24HR IUD 1 each by Intrauterine route once.    . etodolac (LODINE) 200 MG capsule Take 1 capsule (200 mg total) by mouth every 8 (eight) hours. (Patient not taking: Reported on 01/30/2017) 12 capsule 0  . traMADol (ULTRAM) 50 MG tablet Take 1 tablet (50 mg total) by mouth every 6 (six) hours as needed. (Patient not taking: Reported on 01/30/2017) 12 tablet 0   Allergies  Allergen Reactions  . Mango Butter Rash    Social History:   reports that she has never smoked. She has never used smokeless  tobacco. She reports that she drinks alcohol. She reports that she does not use drugs.  Family History  Problem Relation Age of Onset  . Melanoma Mother   . Diabetes Mellitus I Father   . Colon cancer Maternal Grandfather     Review of Systems: Noncontributory  PHYSICAL EXAM: Blood pressure 122/85, pulse 85, temperature 98.1 F (36.7 C), temperature source Temporal, resp. rate 20, height 5\' 5"  (1.651 m), weight 78.5 kg (173 lb), SpO2 100 %, unknown if currently breastfeeding. General appearance - alert, well appearing, and in no distress Chest - clear to auscultation, no wheezes, rales or rhonchi, symmetric air entry Heart - normal rate and regular rhythm Abdomen - soft, nontender, nondistended, no masses or organomegaly Pelvic - external vulva normal, R>L tenderness to palpation in the adnexa, acute pain with exam unable to clearly palpate structures. Extremities - peripheral pulses normal, no pedal edema, no clubbing or cyanosis  Labs: Results for orders placed or performed during the hospital encounter of 01/28/17 (from the past 336 hour(s))  Lipase, blood   Collection Time: 01/28/17  6:22 PM  Result Value Ref Range   Lipase 23 11 - 51 U/L    Imaging Studies: US Transvaginal Non-ob  Result Date: 01/29/2017 CLINICAL DATA:  Follow-up RIGHT ovarian cyst, assess for torsion. RIGHT lower quadrant pain beginning yesterday morning. EXAM: TRANSABDOMINAL AND TRANSVAGINAL ULTRASOUND OF PELVIS DOPPLER  ULTRASOUND OF OVARIES TECHNIQUE: Both transabdominal and transvaginal ultrasound examinations of the pelvis were performed. Transabdominal technique was performed for global imaging of the pelvis including uterus, ovaries, adnexal regions, and pelvic cul-de-sac. It was necessary to proceed with endovaginal exam following the transabdominal exam to visualize the adnexa. Color and duplex Doppler ultrasound was utilized to evaluate blood flow to the ovaries. COMPARISON:  None. FINDINGS: Uterus  Measurements: 7.3 x 4 x 5.3 cm. No fibroids or other mass visualized. Endometrium Thickness: 7 mm. No focal abnormality visualized. Linear echogenic IUD in central endometrium. Right ovary Measurements: 7.2 x 4.2 x 5.4 cm. Normal appearance/no adnexal mass. Anechoic 5.4 x 4.1 x 4.2 cm cyst with increased through transmission, no solid or vascular components. Cyst demonstrates pointed appearance anteriorly corresponding to CT findings. Left ovary Measurements: 3.5 x 2.2 x 2.6 cm. Normal appearance/no adnexal mass. Pulsed Doppler evaluation of both ovaries demonstrates normal low-resistance arterial and venous waveforms. Other findings Small amount of free fluid in the pelvis. IMPRESSION: 5.4 cm simple cyst RIGHT ovary, possibly perforated with small amount of free fluid in the pelvis. No ovarian torsion. Electronically Signed   By: Awilda Metro M.D.   On: 01/29/2017 02:52   US Pelvis Complete  Result Date: 01/29/2017 CLINICAL DATA:  Follow-up RIGHT ovarian cyst, assess for torsion. RIGHT lower quadrant pain beginning yesterday morning. EXAM: TRANSABDOMINAL AND TRANSVAGINAL ULTRASOUND OF PELVIS DOPPLER ULTRASOUND OF OVARIES TECHNIQUE: Both transabdominal and transvaginal ultrasound examinations of the pelvis were performed. Transabdominal technique was performed for global imaging of the pelvis including uterus, ovaries, adnexal regions, and pelvic cul-de-sac. It was necessary to proceed with endovaginal exam following the transabdominal exam to visualize the adnexa. Color and duplex Doppler ultrasound was utilized to evaluate blood flow to the ovaries. COMPARISON:  None. FINDINGS: Uterus Measurements: 7.3 x 4 x 5.3 cm. No fibroids or other mass visualized. Endometrium Thickness: 7 mm. No focal abnormality visualized. Linear echogenic IUD in central endometrium. Right ovary Measurements: 7.2 x 4.2 x 5.4 cm. Normal appearance/no adnexal mass. Anechoic 5.4 x 4.1 x 4.2 cm cyst with increased through transmission,  no solid or vascular components. Cyst demonstrates pointed appearance anteriorly corresponding to CT findings. Left ovary Measurements: 3.5 x 2.2 x 2.6 cm. Normal appearance/no adnexal mass. Pulsed Doppler evaluation of both ovaries demonstrates normal low-resistance arterial and venous waveforms. Other findings Small amount of free fluid in the pelvis. IMPRESSION: 5.4 cm simple cyst RIGHT ovary, possibly perforated with small amount of free fluid in the pelvis. No ovarian torsion. Electronically Signed   By: Awilda Metro M.D.   On: 01/29/2017 02:52   Ct Abdomen Pelvis W Contrast  Result Date: 01/29/2017 CLINICAL DATA:  Right lower quadrant abdominal pain EXAM: CT ABDOMEN AND PELVIS WITH CONTRAST TECHNIQUE: Multidetector CT imaging of the abdomen and pelvis was performed using the standard protocol following bolus administration of intravenous contrast. CONTRAST:  ISOVUE-300 IOPAMIDOL (ISOVUE-300) INJECTION 61% COMPARISON:  None. FINDINGS: Lower chest: Lung bases demonstrate no acute consolidation or pleural effusion. Normal heart size. Hepatobiliary: Subcentimeter hypodense lesion in the dome of the right liver too small to further characterize. No calcified gallstones or biliary dilatation. Pancreas: Unremarkable. No pancreatic ductal dilatation or surrounding inflammatory changes. Spleen: Appears slightly enlarged, measuring 14.2 cm. No focal abnormality. Adrenals/Urinary Tract: Adrenal glands are unremarkable. Kidneys are normal, without renal calculi, focal lesion, or hydronephrosis. Bladder is unremarkable. Stomach/Bowel: Stomach is within normal limits. Appendix appears normal. No evidence of bowel wall thickening, distention, or inflammatory changes. Vascular/Lymphatic:  No significant vascular findings are present. No enlarged abdominal or pelvic lymph nodes. Reproductive: Uterus unremarkable. 5.3 cm cyst in the right ovary. Intrauterine device present in the uterus. No left adnexal mass.  Other: Small fat in the umbilicus.  No free air or free fluid Musculoskeletal: No acute or significant osseous findings. IMPRESSION: 1. Negative for appendicitis 2. 5.3 cm right ovarian cyst. Recommend 6-12 week ultrasound follow-up. 3. Nonspecific subcentimeter hypodense lesion in the dome of the liver 4. The spleen appears slightly enlarged. Electronically Signed   By: Jasmine PangKim  Fujinaga M.D.   On: 01/29/2017 01:05   [place] Art/ven Flow Abd Pelv Doppler  Result Date: 01/29/2017 CLINICAL DATA:  Follow-up RIGHT ovarian cyst, assess for torsion. RIGHT lower quadrant pain beginning yesterday morning. EXAM: TRANSABDOMINAL AND TRANSVAGINAL ULTRASOUND OF PELVIS DOPPLER ULTRASOUND OF OVARIES TECHNIQUE: Both transabdominal and transvaginal ultrasound examinations of the pelvis were performed. Transabdominal technique was performed for global imaging of the pelvis including uterus, ovaries, adnexal regions, and pelvic cul-de-sac. It was necessary to proceed with endovaginal exam following the transabdominal exam to visualize the adnexa. Color and duplex Doppler ultrasound was utilized to evaluate blood flow to the ovaries. COMPARISON:  None. FINDINGS: Uterus Measurements: 7.3 x 4 x 5.3 cm. No fibroids or other mass visualized. Endometrium Thickness: 7 mm. No focal abnormality visualized. Linear echogenic IUD in central endometrium. Right ovary Measurements: 7.2 x 4.2 x 5.4 cm. Normal appearance/no adnexal mass. Anechoic 5.4 x 4.1 x 4.2 cm cyst with increased through transmission, no solid or vascular components. Cyst demonstrates pointed appearance anteriorly corresponding to CT findings. Left ovary Measurements: 3.5 x 2.2 x 2.6 cm. Normal appearance/no adnexal mass. Pulsed Doppler evaluation of both ovaries demonstrates normal low-resistance arterial and venous waveforms. Other findings Small amount of free fluid in the pelvis. IMPRESSION: 5.4 cm simple cyst RIGHT ovary, possibly perforated with small amount of free fluid in the  pelvis. No ovarian torsion. Electronically Signed   By: Awilda Metroourtnay  Bloomer M.D.   On: 01/29/2017 02:52    Assessment: 31yo with acute pelvic pain and 5+cm ovarian cyst.  Cannot rule out torsion  Plan: Patient will undergo surgical management with operative laparoscopy.   The risks of surgery were discussed in detail with the patient including but not limited to: bleeding which may require transfusion or reoperation; infection which may require antibiotics; injury to surrounding organs which may involve bowel, bladder, ureters ; need for additional procedures including laparoscopy or laparotomy; thromboembolic phenomenon, surgical site problems and other postoperative/anesthesia complications. Likelihood of success in alleviating the patient's condition was discussed. Routine postoperative instructions will be reviewed with the patient and her family in detail after surgery.  The patient concurred with the proposed plan, giving informed written consent for the surgery.  Patient has been NPO since this morning, she will remain NPO for procedure.  Anesthesia and OR aware.  SCDs ordered on call to the OR.  To OR when ready.  This exam and procedure are done on an emergent basis.  ----- Ranae Plumberhelsea Columbia Pandey, MD Attending Obstetrician and Gynecologist [facility], Department of OB/GYN [facility]

## 2017-01-31 ENCOUNTER — Encounter: Payer: Self-pay | Admitting: Obstetrics & Gynecology

## 2017-02-01 LAB — SURGICAL PATHOLOGY

## 2017-02-01 LAB — CYTOLOGY - NON PAP

## 2017-02-02 ENCOUNTER — Ambulatory Visit: Payer: 59 | Admitting: Obstetrics and Gynecology

## 2018-07-26 IMAGING — CT CT ABD-PELV W/ CM
2 of 4 series · 16 of 46 positions shown, 18 images · IV contrast (APPLIED)
Comparison: None.

CLINICAL DATA: Right lower quadrant abdominal pain

EXAM:
CT ABDOMEN AND PELVIS WITH CONTRAST
TECHNIQUE: Multidetector CT imaging of the abdomen and pelvis was performed
using the standard protocol following bolus administration of
intravenous contrast.
CONTRAST:  100mL 1IU3MH-7II IOPAMIDOL (1IU3MH-7II) INJECTION 61%

[Series 2: routine abd/pel with · axial · 0.91mm/px · z∈[-542,-122]mm · 13 of 92 slices shown, 15 images]
[im 4/92  soft-tissue]
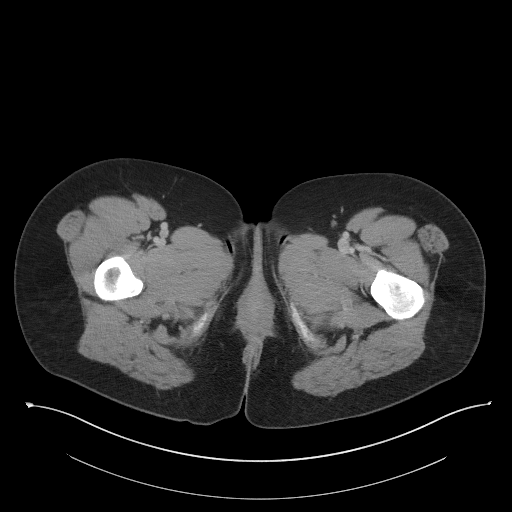
[im 4/92  bone]
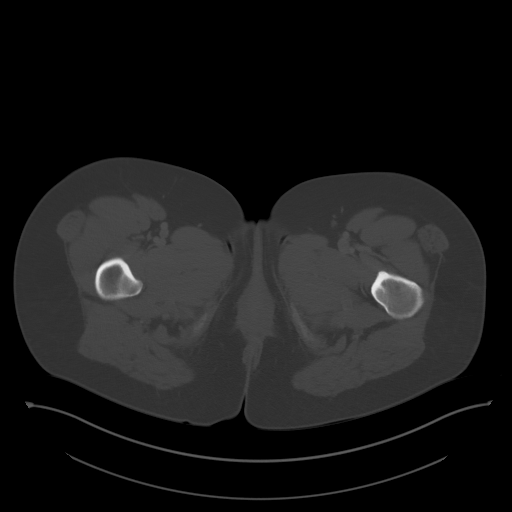
[im 12/92  soft-tissue]
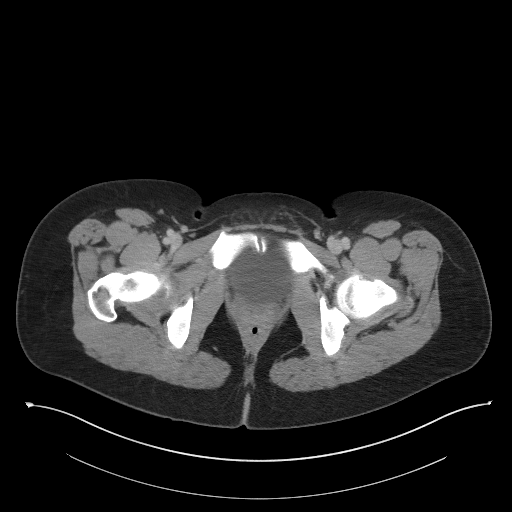
[im 19/92  soft-tissue]
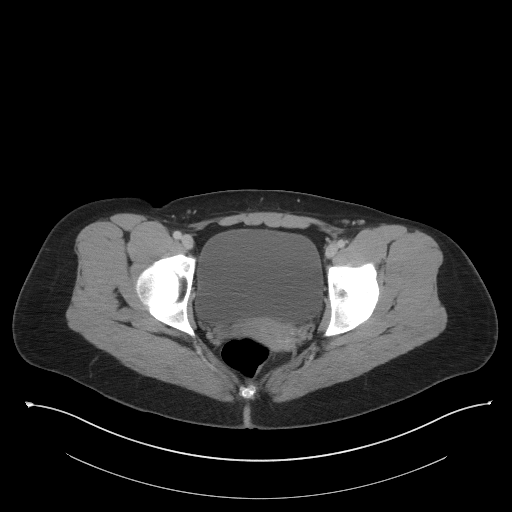
[im 27/92  soft-tissue]
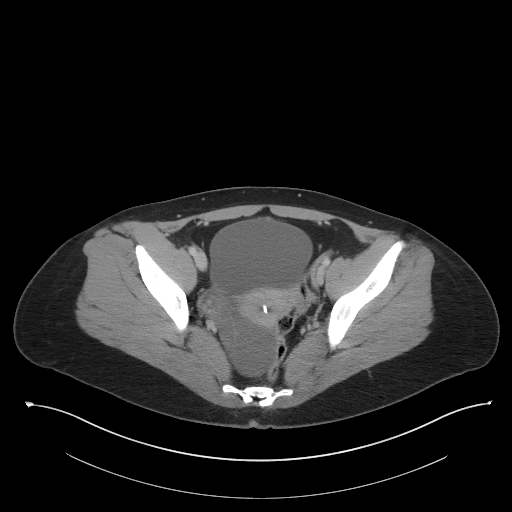
[im 31/92  soft-tissue]
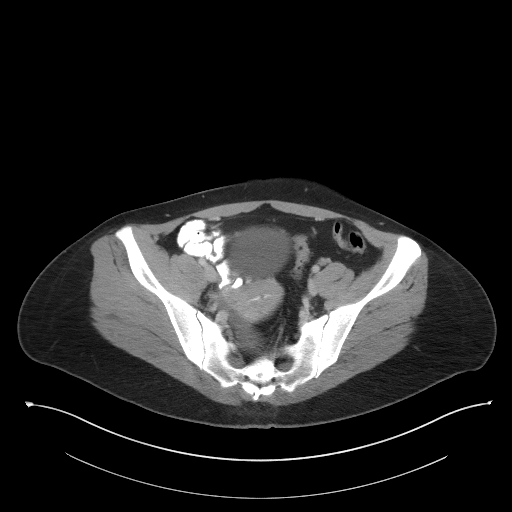
[im 38/92  soft-tissue]
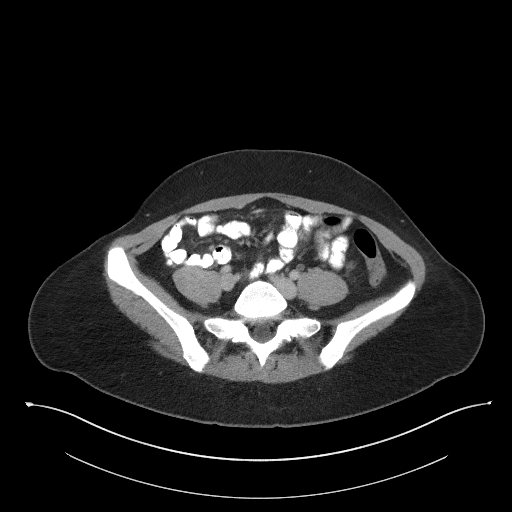
[im 46/92  soft-tissue]
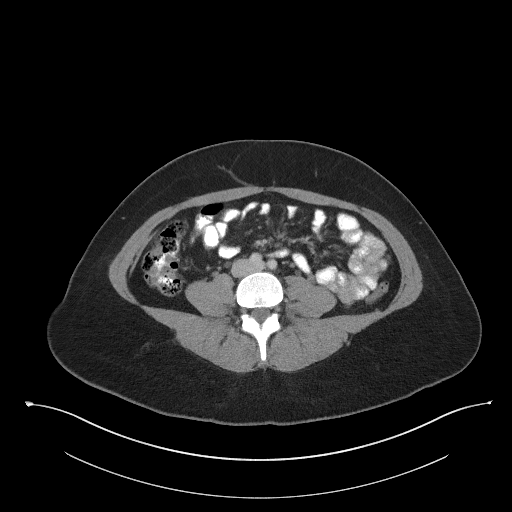
[im 54/92  soft-tissue]
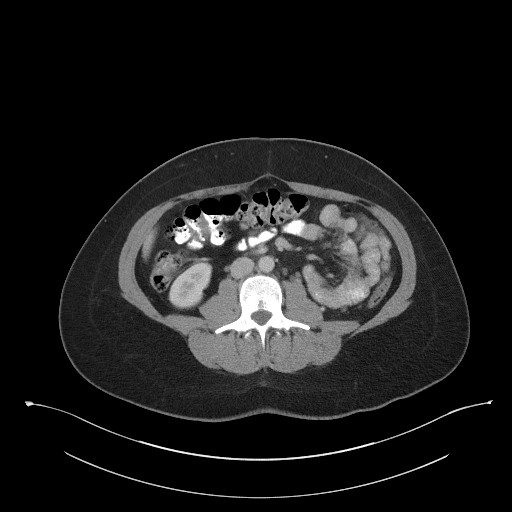
[im 61/92  soft-tissue]
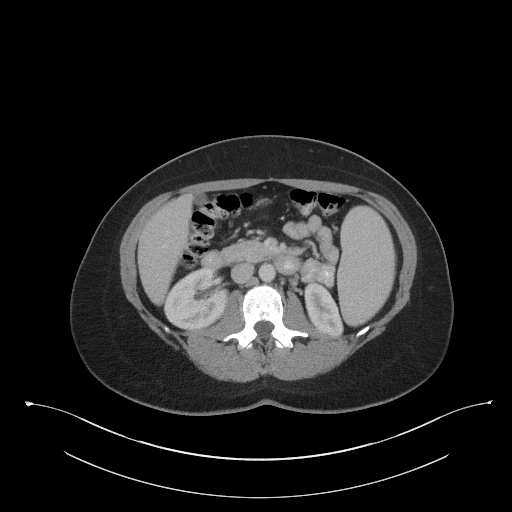
[im 61/92  bone]
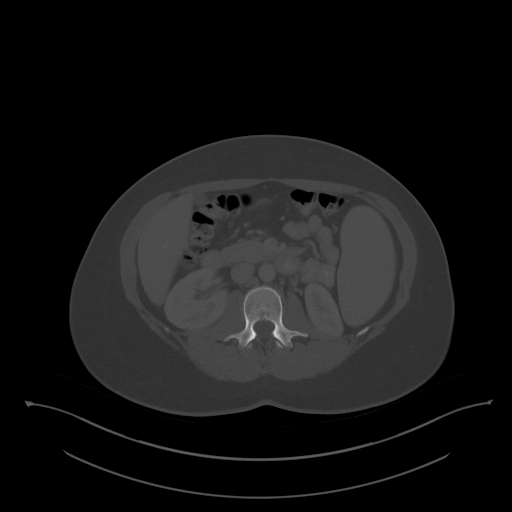
[im 65/92  soft-tissue]
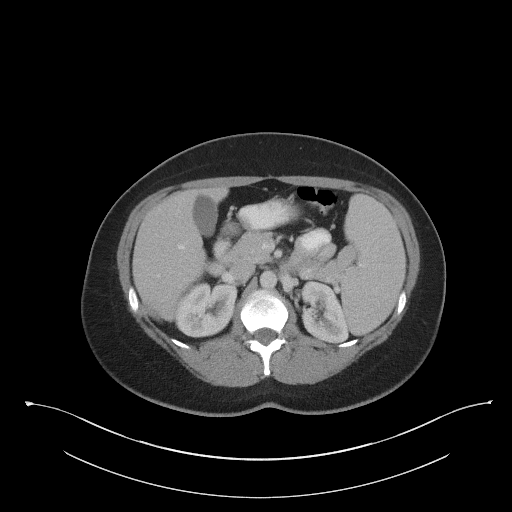
[im 73/92  soft-tissue]
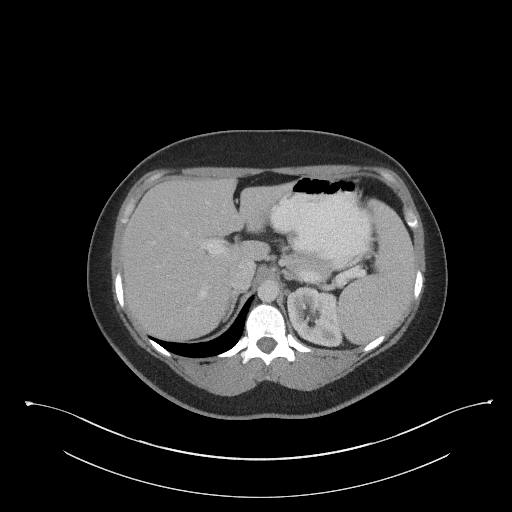
[im 80/92  soft-tissue]
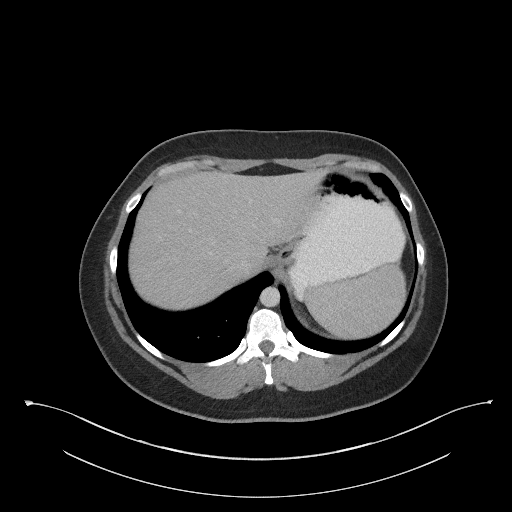
[im 88/92  soft-tissue]
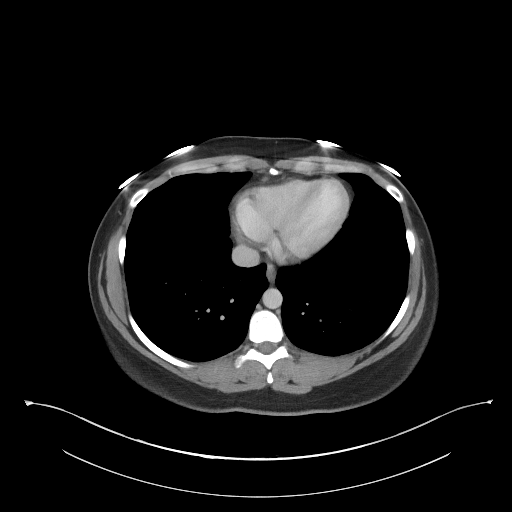

[Series 5: coronal st · coronal · 0.68mm/px · 3 of 88 slices shown]
[im 30/88  soft-tissue]
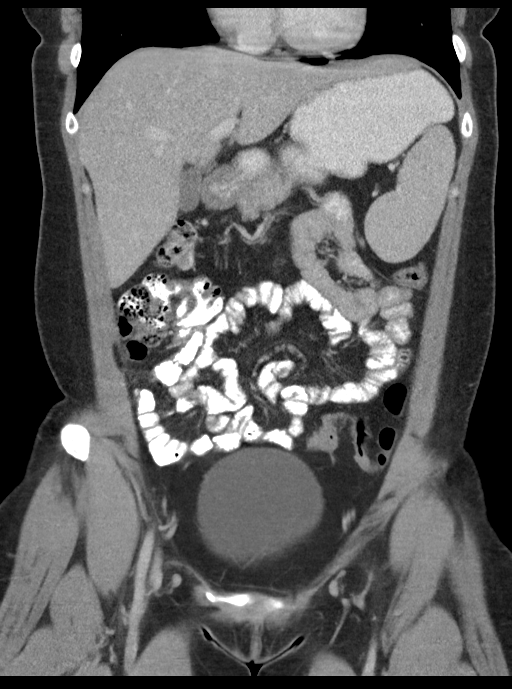
[im 39/88  soft-tissue]
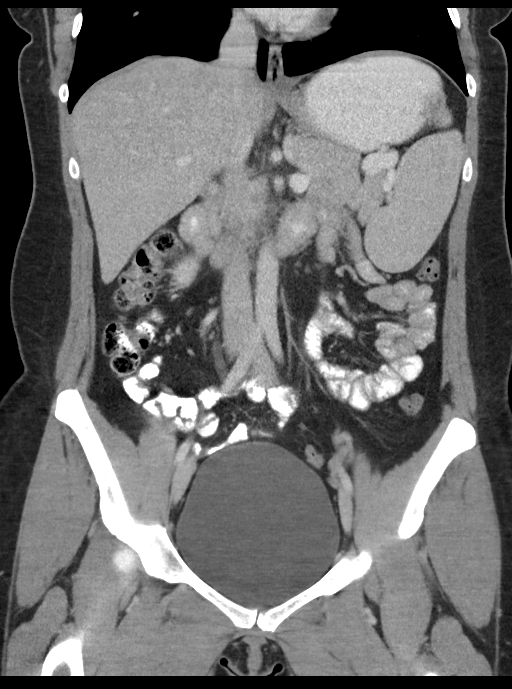
[im 49/88  soft-tissue]
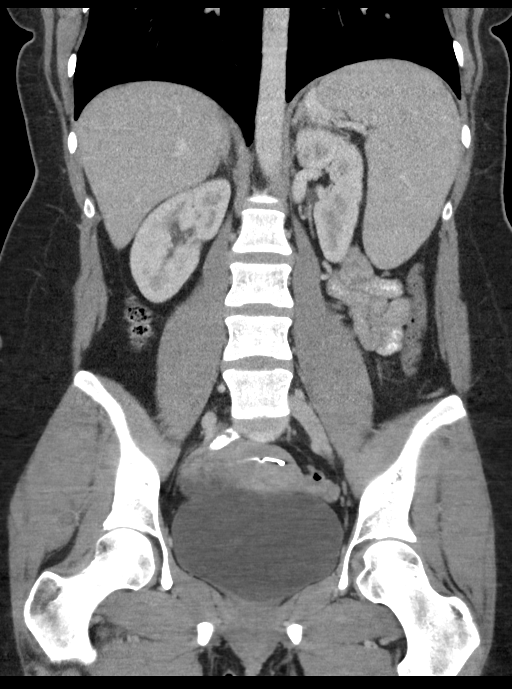

[16 of 46 positions shown; findings below may reference images not displayed]

FINDINGS: Lower chest: Lung bases demonstrate no acute consolidation or
pleural effusion. Normal heart size.

Hepatobiliary: Subcentimeter hypodense lesion in the dome of the
right liver too small to further characterize. No calcified
gallstones or biliary dilatation.

Pancreas: Unremarkable. No pancreatic ductal dilatation or
surrounding inflammatory changes.

Spleen: Appears slightly enlarged, measuring 14.2 cm. No focal
abnormality.

Adrenals/Urinary Tract: Adrenal glands are unremarkable. Kidneys are
normal, without renal calculi, focal lesion, or hydronephrosis.
Bladder is unremarkable.

Stomach/Bowel: Stomach is within normal limits. Appendix appears
normal. No evidence of bowel wall thickening, distention, or
inflammatory changes.

Vascular/Lymphatic: No significant vascular findings are present. No
enlarged abdominal or pelvic lymph nodes.

Reproductive: Uterus unremarkable. 5.3 cm cyst in the right ovary.
Intrauterine device present in the uterus. No left adnexal mass.

Other: Small fat in the umbilicus.  No free air or free fluid

Musculoskeletal: No acute or significant osseous findings.
IMPRESSION: 1. Negative for appendicitis
2. 5.3 cm right ovarian cyst. Recommend 6-12 week ultrasound
follow-up.
3. Nonspecific subcentimeter hypodense lesion in the dome of the
liver
4. The spleen appears slightly enlarged.

## 2018-12-23 IMAGING — US US PELVIS COMPLETE
1 series · 13 of 25 positions shown · non-contrast
Comparison: None.

CLINICAL DATA: Follow-up RIGHT ovarian cyst, assess for torsion.
RIGHT lower quadrant pain beginning yesterday morning.

EXAM:
TRANSABDOMINAL AND TRANSVAGINAL ULTRASOUND OF PELVIS
DOPPLER ULTRASOUND OF OVARIES
TECHNIQUE: Both transabdominal and transvaginal ultrasound examinations of the
pelvis were performed. Transabdominal technique was performed for
global imaging of the pelvis including uterus, ovaries, adnexal
regions, and pelvic cul-de-sac.
It was necessary to proceed with endovaginal exam following the
transabdominal exam to visualize the adnexa. Color and duplex
Doppler ultrasound was utilized to evaluate blood flow to the
ovaries.

[Series 1: us pelvis complete · 0.21mm/px · 13 of 107 slices shown]
[im 1/107]
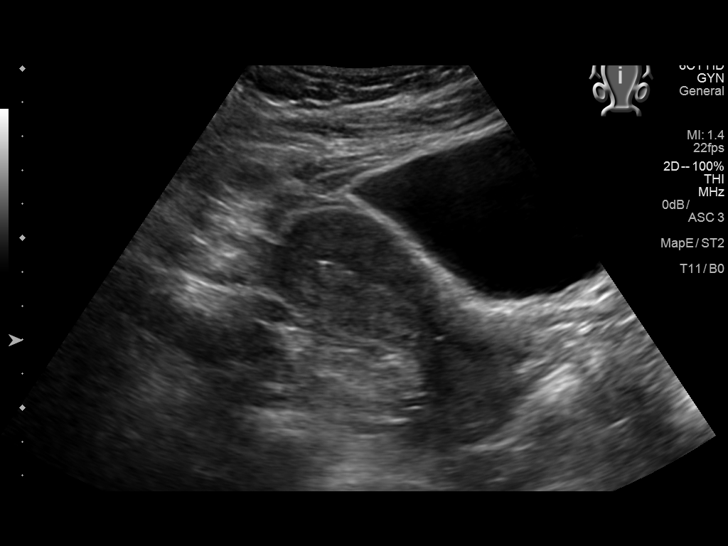
[im 9/107]
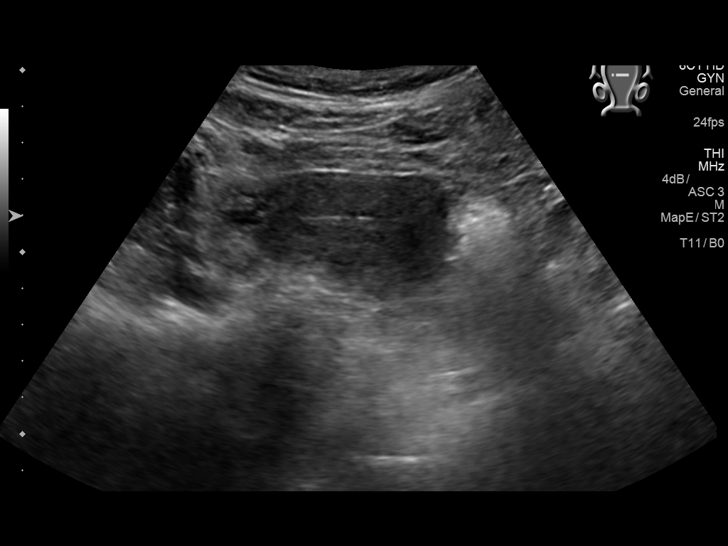
[im 18/107]
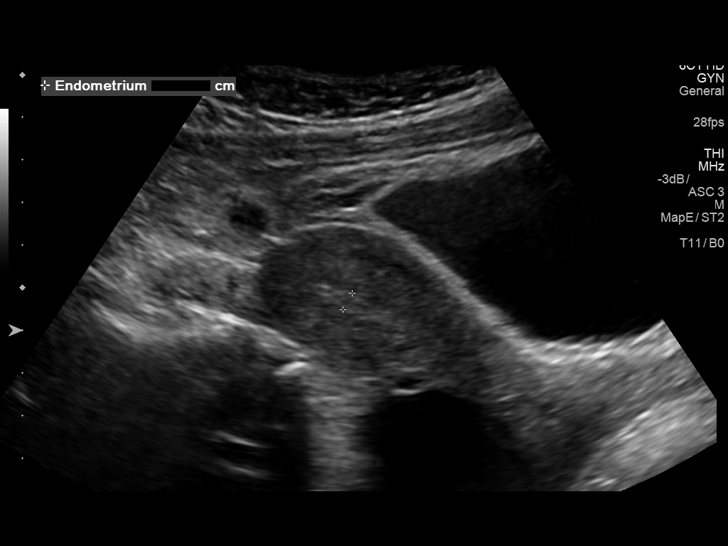
[im 27/107]
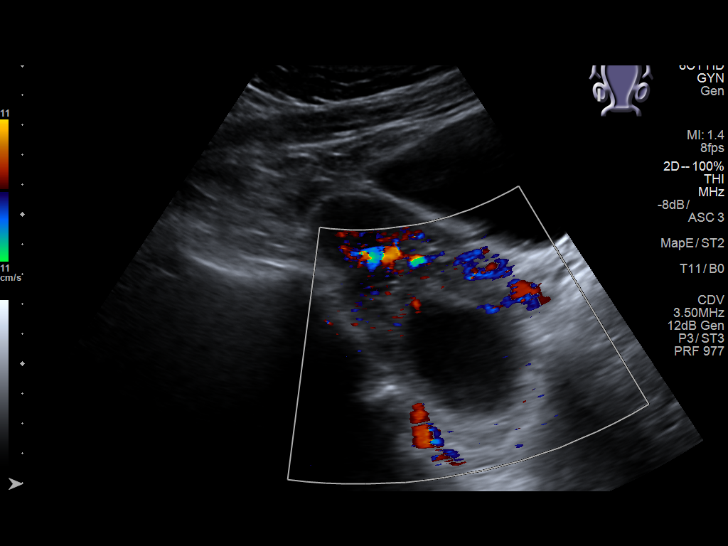
[im 36/107]
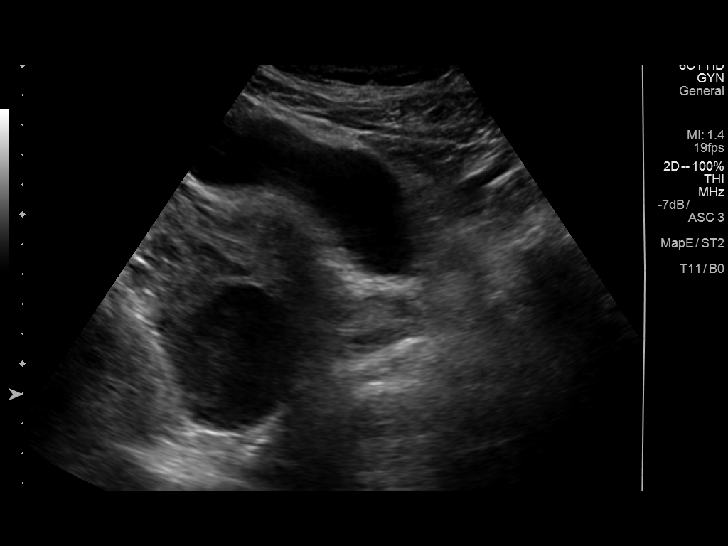
[im 45/107]
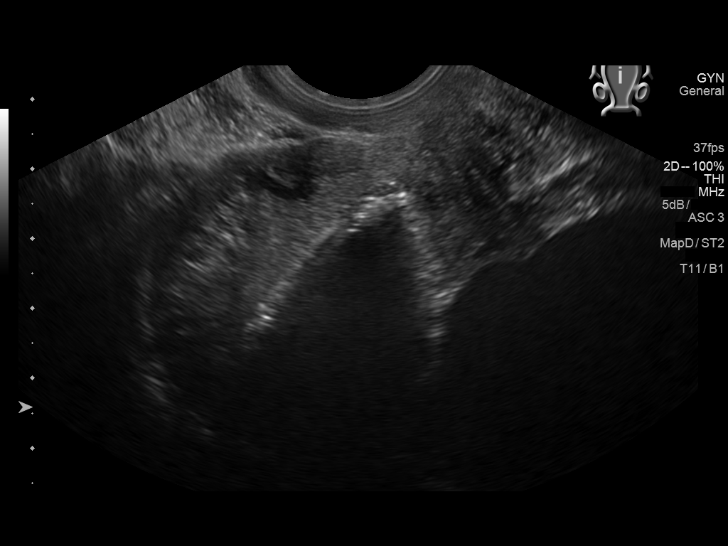
[im 54/107]
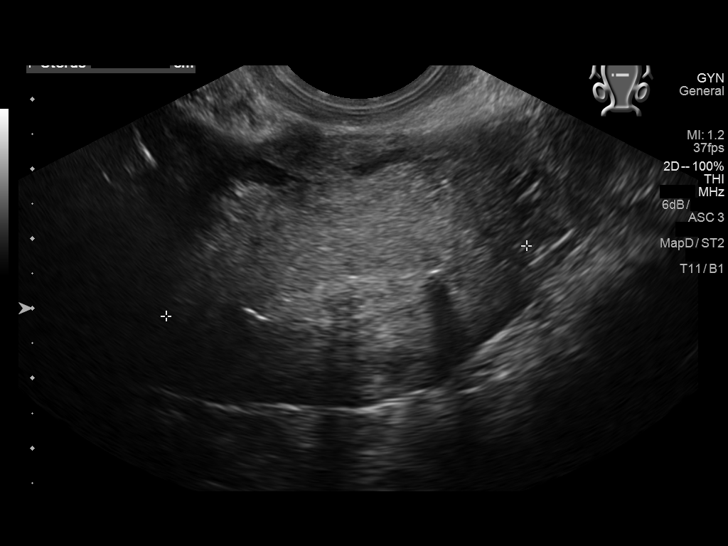
[im 62/107]
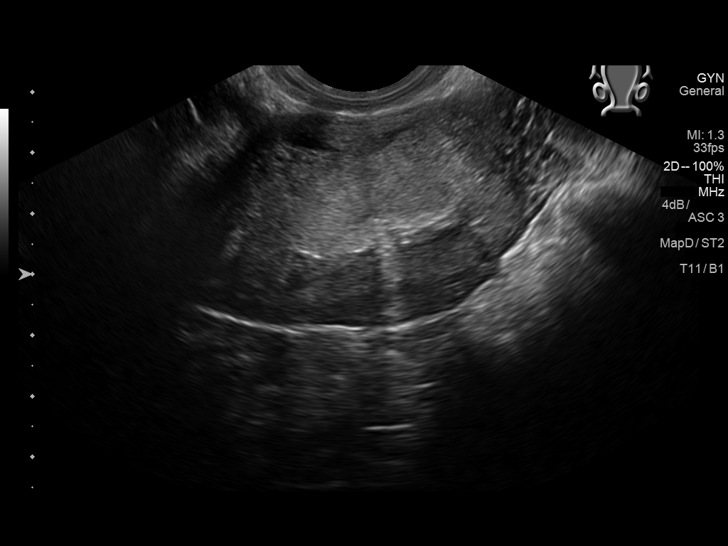
[im 71/107]
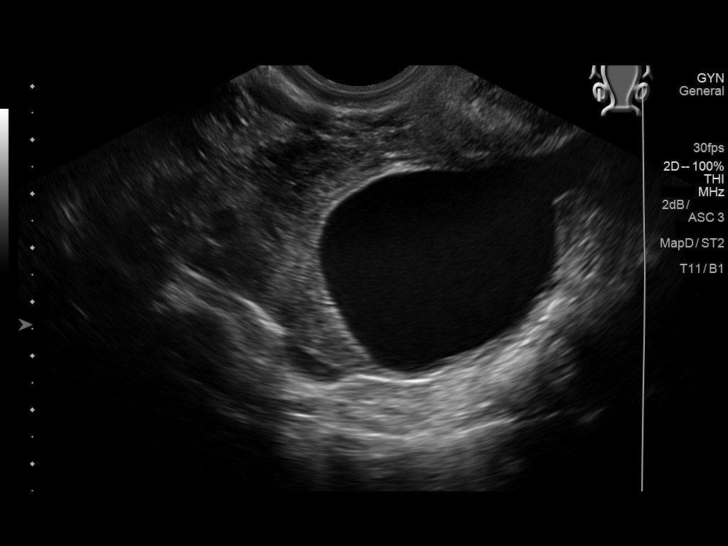
[im 80/107]
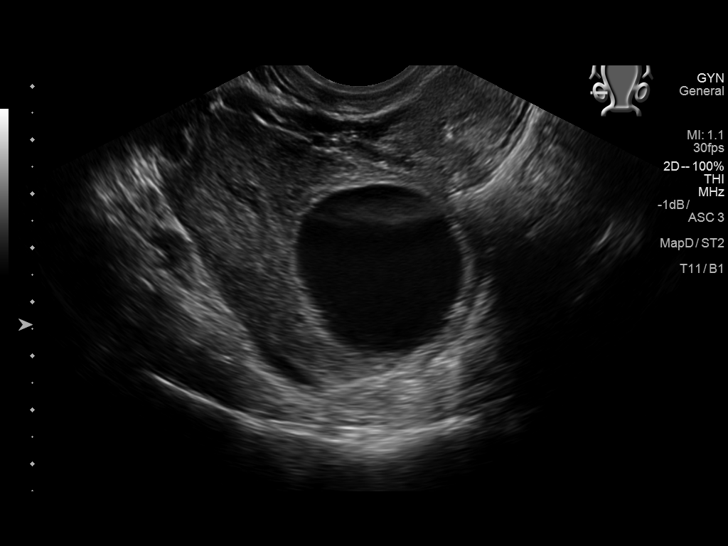
[im 89/107]
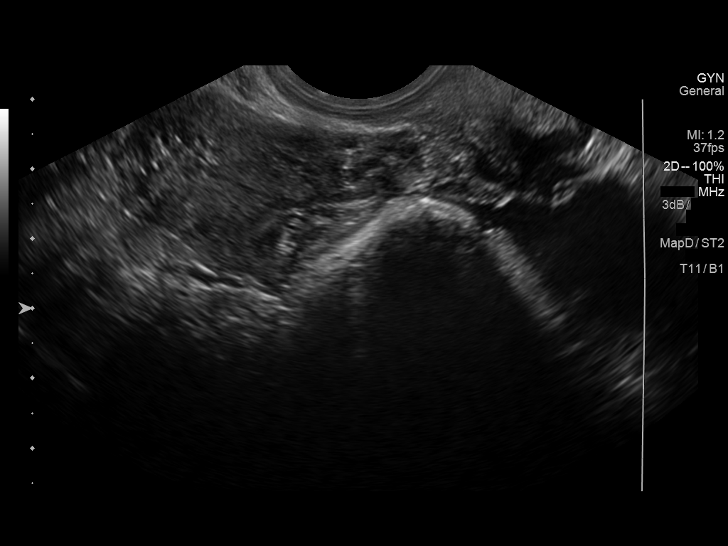
[im 98/107]
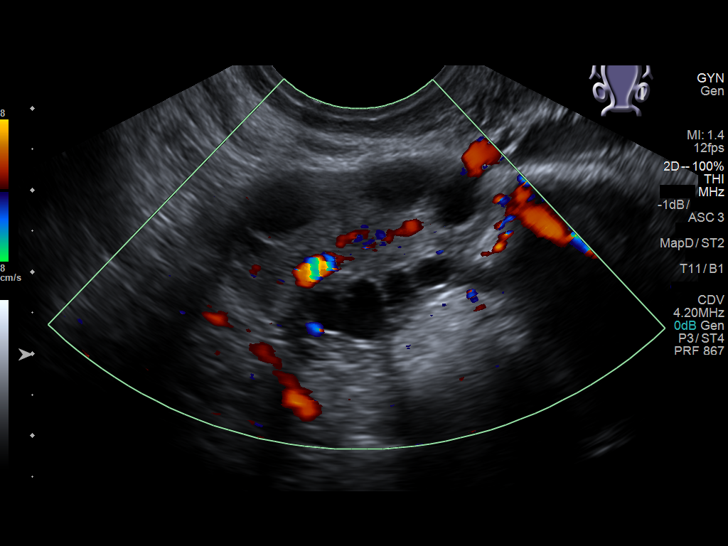
[im 107/107]
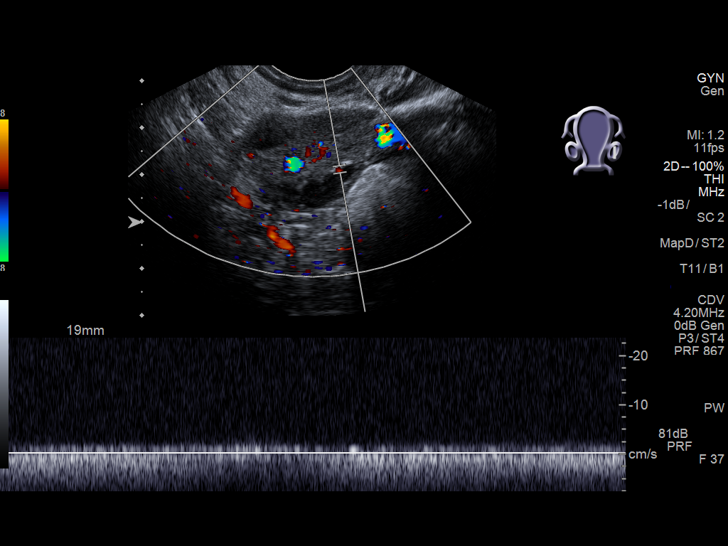

[13 of 25 positions shown; findings below may reference images not displayed]

FINDINGS: Uterus

Measurements: 7.3 x 4 x 5.3 cm. No fibroids or other mass
visualized.

Endometrium

Thickness: 7 mm. No focal abnormality visualized. Linear echogenic
IUD in central endometrium.

Right ovary

Measurements: 7.2 x 4.2 x 5.4 cm. Normal appearance/no adnexal mass.
Anechoic 5.4 x 4.1 x 4.2 cm cyst with increased through
transmission, no solid or vascular components. Cyst demonstrates
pointed appearance anteriorly corresponding to CT findings.

Left ovary

Measurements: 3.5 x 2.2 x 2.6 cm. Normal appearance/no adnexal mass.

Pulsed Doppler evaluation of both ovaries demonstrates normal
low-resistance arterial and venous waveforms.

Other findings

Small amount of free fluid in the pelvis.
IMPRESSION: 5.4 cm simple cyst RIGHT ovary, possibly perforated with small
amount of free fluid in the pelvis.

No ovarian torsion.

## 2021-02-03 ENCOUNTER — Other Ambulatory Visit: Payer: Self-pay

## 2021-02-03 ENCOUNTER — Emergency Department
Admission: EM | Admit: 2021-02-03 | Discharge: 2021-02-03 | Disposition: A | Discharge status: Home / Self Care | Payer: BC Managed Care – PPO | Attending: Emergency Medicine | Admitting: Emergency Medicine

## 2021-02-03 DIAGNOSIS — R519 Headache, unspecified: Secondary | ICD-10-CM | POA: Diagnosis present

## 2021-02-03 DIAGNOSIS — H539 Unspecified visual disturbance: Secondary | ICD-10-CM

## 2021-02-03 DIAGNOSIS — G43909 Migraine, unspecified, not intractable, without status migrainosus: Secondary | ICD-10-CM | POA: Diagnosis not present

## 2021-02-03 LAB — CBC WITH DIFFERENTIAL/PLATELET
Abs Immature Granulocytes: 0.03 10*3/uL (ref 0.00–0.07)
Basophils Absolute: 0 10*3/uL (ref 0.0–0.1)
Basophils Relative: 0 %
Eosinophils Absolute: 0 10*3/uL (ref 0.0–0.5)
Eosinophils Relative: 0 %
HCT: 41.7 % (ref 36.0–46.0)
Hemoglobin: 14.3 g/dL (ref 12.0–15.0)
Immature Granulocytes: 0 %
Lymphocytes Relative: 8 %
Lymphs Abs: 0.8 10*3/uL (ref 0.7–4.0)
MCH: 29.9 pg (ref 26.0–34.0)
MCHC: 34.3 g/dL (ref 30.0–36.0)
MCV: 87.1 fL (ref 80.0–100.0)
Monocytes Absolute: 0.2 10*3/uL (ref 0.1–1.0)
Monocytes Relative: 2 %
Neutro Abs: 8.4 10*3/uL — ABNORMAL HIGH (ref 1.7–7.7)
Neutrophils Relative %: 90 %
Platelets: 146 10*3/uL — ABNORMAL LOW (ref 150–400)
RBC: 4.79 MIL/uL (ref 3.87–5.11)
RDW: 12.4 % (ref 11.5–15.5)
WBC: 9.5 10*3/uL (ref 4.0–10.5)
nRBC: 0 % (ref 0.0–0.2)

## 2021-02-03 LAB — BASIC METABOLIC PANEL
Anion gap: 9 (ref 5–15)
BUN: 12 mg/dL (ref 6–20)
CO2: 25 mmol/L (ref 22–32)
Calcium: 9.2 mg/dL (ref 8.9–10.3)
Chloride: 105 mmol/L (ref 98–111)
Creatinine, Ser: 0.85 mg/dL (ref 0.44–1.00)
GFR, Estimated: >60 mL/min (ref >60)
Glucose, Bld: 110 mg/dL — ABNORMAL HIGH (ref 70–99)
Potassium: 4.4 mmol/L (ref 3.5–5.1)
Sodium: 139 mmol/L (ref 135–145)

## 2021-02-03 LAB — POC URINE PREG, ED: Preg Test, Ur: NEGATIVE

## 2021-02-03 LAB — MAGNESIUM: Magnesium: 2 mg/dL (ref 1.7–2.4)

## 2021-02-03 MED ORDER — PROCHLORPERAZINE MALEATE 10 MG PO TABS: 10.0000 mg | ORAL_TABLET | Freq: Four times a day (QID) | ORAL | 0 refills | Status: DC | PRN

## 2021-02-03 NOTE — ED Provider Notes (Signed)
[facility] Emergency Department Provider Note   ____________________________________________   Event Date/Time   First MD Initiated Contact with Patient 02/03/21 1907     (approximate)  I have reviewed the triage vital signs and the nursing notes.   HISTORY  Chief Complaint Headache   HPI Joyce Schwartz is a 35 y.o. female with past medical history of migraines who presents to the ED complaining of headache.  Patient reports that around 230 this afternoon she developed diffuse throbbing headache associated with some blurry vision.  She states that it is not unusual for her to have some blurry vision with her headaches.  She reports a long history of migraines for which she follows with her PCP, however she has never seen a neurologist for this problem.  Her headache and vision changes seem to gradually improve and she was able to take a nap later in the afternoon.  When she woke up, vision changes seem to come back with some blurriness and part of her visual field.  She denies any eye pain, flashes, or floaters.  Vision changes have again gradually improved and she now feels like her vision is back to normal.  She was initially evaluated at the [facility] walk-in clinic, where she was advised to be further evaluated in the ED.        Past Medical History:  Diagnosis Date  . Gestational thrombocytopenia without hemorrhage in third trimester (HCC)   . Menstrual migraine     Patient Active Problem List   Diagnosis Date Noted  . Acute pelvic pain 01/30/2017  . Ovarian cyst 01/30/2017    Past Surgical History:  Procedure Laterality Date  . LAPAROSCOPY Right 01/30/2017   Procedure: LAPAROSCOPY DIAGNOSTIC/ OVARIAN CYST POSSIBLE TORSION, Removal of IUD;  Surgeon: Ward, Elenora Fender, MD;  Location: [facility] ORS;  Service: Gynecology;  Laterality: Right;  . WISDOM TOOTH EXTRACTION      Prior to Admission medications   Medication Sig Start Date End Date Taking?  Authorizing Provider  prochlorperazine (COMPAZINE) 10 MG tablet Take 1 tablet (10 mg total) by mouth every 6 (six) hours as needed for nausea or vomiting. 02/03/21  Yes Chesley Noon, MD  ibuprofen (ADVIL,MOTRIN) 600 MG tablet Take 1 tablet (600 mg total) by mouth every 6 (six) hours. 01/30/17   Ward, Elenora Fender, MD    Allergies Mango butter  Family History  Problem Relation Age of Onset  . Melanoma Mother   . Diabetes Mellitus I Father   . Colon cancer Maternal Grandfather     Social History Social History   Tobacco Use  . Smoking status: Never Smoker  . Smokeless tobacco: Never Used  Vaping Use  . Vaping Use: Never used  Substance Use Topics  . Alcohol use: Yes    Comment: weekly  . Drug use: No    Review of Systems  Constitutional: No fever/chills Eyes: Positive for visual changes. ENT: No sore throat. Cardiovascular: Denies chest pain. Respiratory: Denies shortness of breath. Gastrointestinal: No abdominal pain.  No nausea, no vomiting.  No diarrhea.  No constipation. Genitourinary: Negative for dysuria. Musculoskeletal: Negative for back pain. Skin: Negative for rash. Neurological: Positive for headaches, negative for focal weakness or numbness.  ____________________________________________   PHYSICAL EXAM:  VITAL SIGNS: ED Triage Vitals  Enc Vitals Group     BP 02/03/21 1907 (!) 141/87     Pulse Rate 02/03/21 1907 (!) 105     Resp 02/03/21 1907 20  Temp 02/03/21 1907 97.9 F (36.6 C)     Temp Source 02/03/21 1907 Oral     SpO2 02/03/21 1907 100 %     Weight 02/03/21 1908 168 lb (76.2 kg)     Height 02/03/21 1908 5\' 5"  (1.651 m)     Head Circumference --      Peak Flow --      Pain Score 02/03/21 1908 1     Pain Loc --      Pain Edu? --      Excl. in GC? --     Constitutional: Alert and oriented. Eyes: Conjunctivae are normal.  Pupils equal round and reactive to light bilaterally, extraocular movements intact without nystagmus. Head:  Atraumatic. Nose: No congestion/rhinnorhea. Mouth/Throat: Mucous membranes are moist. Neck: Normal ROM Cardiovascular: Normal rate, regular rhythm. Grossly normal heart sounds. Respiratory: Normal respiratory effort.  No retractions. Lungs CTAB. Gastrointestinal: Soft and nontender. No distention. Genitourinary: deferred Musculoskeletal: No lower extremity tenderness nor edema. Neurologic:  Normal speech and language. No gross focal neurologic deficits are appreciated. Skin:  Skin is warm, dry and intact. No rash noted. Psychiatric: Mood and affect are normal. Speech and behavior are normal.  ____________________________________________   LABS (all labs ordered are listed, but only abnormal results are displayed)  Labs Reviewed  CBC WITH DIFFERENTIAL/PLATELET - Abnormal; Notable for the following components:      Result Value   Platelets 146 (*)    Neutro Abs 8.4 (*)    All other components within normal limits  BASIC METABOLIC PANEL - Abnormal; Notable for the following components:   Glucose, Bld 110 (*)    All other components within normal limits  MAGNESIUM  POC URINE PREG, ED    PROCEDURES  Procedure(s) performed (including Critical Care):  Procedures   ____________________________________________   INITIAL IMPRESSION / ASSESSMENT AND PLAN / ED COURSE       35 year old female with past medical history of migraines who presents to the ED with headache associated with some blurriness in her visual field starting this afternoon.  Both her headache and vision changes have gradually improved and she feels like her vision is back to normal.  She has no focal neurologic deficits on exam.  She has a long history of similar headaches, although visual changes persisted longer than usual today.  Symptoms sound consistent with migraine and I doubt stroke or TIA given her lack of risk factors.  Patient is agreeable with holding off on head imaging at this time, labs were  unremarkable with no electrolyte abnormality.  She is appropriate for discharge home and was provided with referral to to neurology, was counseled to return to the ED for new worsening symptoms, patient agrees with plan.      ____________________________________________   FINAL CLINICAL IMPRESSION(S) / ED DIAGNOSES  Final diagnoses:  Migraine without status migrainosus, not intractable, unspecified migraine type  Visual disturbance     ED Discharge Orders         Ordered    prochlorperazine (COMPAZINE) 10 MG tablet  Every 6 hours PRN        02/03/21 2104           Note:  This document was prepared using Dragon voice recognition software and may include unintentional dictation errors.   2105, MD 02/03/21 [phone]

## 2021-02-03 NOTE — ED Triage Notes (Signed)
Pt in with co migraine since 1430 states pain is better but now has visual disturbance.

## 2021-05-10 ENCOUNTER — Encounter: Payer: Self-pay | Admitting: Neurology

## 2021-05-10 ENCOUNTER — Ambulatory Visit (INDEPENDENT_AMBULATORY_CARE_PROVIDER_SITE_OTHER): Payer: BC Managed Care – PPO | Admitting: Neurology

## 2021-05-10 ENCOUNTER — Other Ambulatory Visit: Payer: Self-pay

## 2021-05-10 VITALS — BP 115/72 | HR 71 | Ht 65.0 in | Wt 171.8 lb

## 2021-05-10 DIAGNOSIS — R519 Headache, unspecified: Secondary | ICD-10-CM | POA: Diagnosis not present

## 2021-05-10 DIAGNOSIS — H539 Unspecified visual disturbance: Secondary | ICD-10-CM

## 2021-05-10 DIAGNOSIS — R51 Headache with orthostatic component, not elsewhere classified: Secondary | ICD-10-CM | POA: Diagnosis not present

## 2021-05-10 DIAGNOSIS — G43109 Migraine with aura, not intractable, without status migrainosus: Secondary | ICD-10-CM

## 2021-05-10 DIAGNOSIS — H53453 Other localized visual field defect, bilateral: Secondary | ICD-10-CM

## 2021-05-10 NOTE — Patient Instructions (Signed)
MRI of the brain Thyroid lab Migraine Headache A migraine headache is an intense, throbbing pain on one side or both sides of the head. Migraine headaches may also cause other symptoms, such as nausea, vomiting, and sensitivity to light and noise. A migraine headache can last from 4 hours to 3 days. Talk with your doctor about what things may bring on (trigger) your migraine headaches. What are the causes? The exact cause of this condition is not known. However, a migraine may be caused when nerves in the brain become irritated and release chemicals that cause inflammation of blood vessels. This inflammation causes pain. This condition may be triggered or caused by: Drinking alcohol. Smoking. Taking medicines, such as: Medicine used to treat chest pain (nitroglycerin). Birth control pills. Estrogen. Certain blood pressure medicines. Eating or drinking products that contain nitrates, glutamate, aspartame, or tyramine. Aged cheeses, chocolate, or caffeine may also be triggers. Doing physical activity. Other things that may trigger a migraine headache include: Menstruation. Pregnancy. Hunger. Stress. Lack of sleep or too much sleep. Weather changes. Fatigue. What increases the risk? The following factors may make you more likely to experience migraine headaches: Being a certain age. This condition is more common in people who are 80-46 years old. Being female. Having a family history of migraine headaches. Being Caucasian. Having a mental health condition, such as depression or anxiety. Being obese. What are the signs or symptoms? The main symptom of this condition is pulsating or throbbing pain. This pain may: Happen in any area of the head, such as on one side or both sides. Interfere with daily activities. Get worse with physical activity. Get worse with exposure to bright lights or loud noises. Other symptoms may include: Nausea. Vomiting. Dizziness. General sensitivity to  bright lights, loud noises, or smells. Before you get a migraine headache, you may get warning signs (an aura). An aura may include: Seeing flashing lights or having blind spots. Seeing bright spots, halos, or zigzag lines. Having tunnel vision or blurred vision. Having numbness or a tingling feeling. Having trouble talking. Having muscle weakness. Some people have symptoms after a migraine headache (postdromal phase), such as: Feeling tired. Difficulty concentrating. How is this diagnosed? A migraine headache can be diagnosed based on: Your symptoms. A physical exam. Tests, such as: CT scan or an MRI of the head. These imaging tests can help rule out other causes of headaches. Taking fluid from the spine (lumbar puncture) and analyzing it (cerebrospinal fluid analysis, or CSF analysis). How is this treated? This condition may be treated with medicines that: Relieve pain. Relieve nausea. Prevent migraine headaches. Treatment for this condition may also include: Acupuncture. Lifestyle changes like avoiding foods that trigger migraine headaches. Biofeedback. Cognitive behavioral therapy. Follow these instructions at home: Medicines Take over-the-counter and prescription medicines only as told by your health care provider. Ask your health care provider if the medicine prescribed to you: Requires you to avoid driving or using heavy machinery. Can cause constipation. You may need to take these actions to prevent or treat constipation: Drink enough fluid to keep your urine pale yellow. Take over-the-counter or prescription medicines. Eat foods that are high in fiber, such as beans, whole grains, and fresh fruits and vegetables. Limit foods that are high in fat and processed sugars, such as fried or sweet foods. Lifestyle Do not drink alcohol. Do not use any products that contain nicotine or tobacco, such as cigarettes, e-cigarettes, and chewing tobacco. If you need help quitting,  ask your health  care provider. Get at least 8 hours of sleep every night. Find ways to manage stress, such as meditation, deep breathing, or yoga. General instructions     Keep a journal to find out what may trigger your migraine headaches. For example, write down: What you eat and drink. How much sleep you get. Any change to your diet or medicines. If you have a migraine headache: Avoid things that make your symptoms worse, such as bright lights. It may help to lie down in a dark, quiet room. Do not drive or use heavy machinery. Ask your health care provider what activities are safe for you while you are experiencing symptoms. Keep all follow-up visits as told by your health care provider. This is important. Contact a health care provider if: You develop symptoms that are different or more severe than your usual migraine headache symptoms. You have more than 15 headache days in one month. Get help right away if: Your migraine headache becomes severe. Your migraine headache lasts longer than 72 hours. You have a fever. You have a stiff neck. You have vision loss. Your muscles feel weak or like you cannot control them. You start to lose your balance often. You have trouble walking. You faint. You have a seizure. Summary A migraine headache is an intense, throbbing pain on one side or both sides of the head. Migraines may also cause other symptoms, such as nausea, vomiting, and sensitivity to light and noise. This condition may be treated with medicines and lifestyle changes. You may also need to avoid certain things that trigger a migraine headache. Keep a journal to find out what may trigger your migraine headaches. Contact your health care provider if you have more than 15 headache days in a month or you develop symptoms that are different or more severe than your usual migraine headache symptoms. This information is not intended to replace advice given to you by your health care  provider. Make sure you discuss any questions you have with your healthcare provider. Document Revised: 01/04/2019 Document Reviewed: 10/25/2018 Elsevier Patient Education  2022 [employer].

## 2021-05-10 NOTE — Progress Notes (Signed)
[facility]    Provider:  Dr Lucia Gaskins Requesting Provider: Ward, Elenora Fender, MD Primary Care Provider:  Kandyce Rud, MD  CC:  headaches  HPI:  Joyce Schwartz is a 35 y.o. female here as requested by Ward, Elenora Fender, MD for headaches and vision changes.  Past medical history migraines.  I reviewed emergency room notes, she presented to the emergency room complaining of headache, diffuse throbbing, blurry vision, blurry vision not unusual with her headaches, long history of migraines for which she follows with PCP, never seen a neurologist, her headache and vision changes seem to gradually improve and she was able to take a nap later in the afternoon, when she woke up vision changes seem to come back with some blurriness and parts of her visual field loss.  No focal neurologic deficits on exam.  She has a long history of similar headaches although visual changes persisted longer than usual, symptoms are consistent with migraines but TIA was mentioned.  Headaches/migraines since college, sister has migraines. Before 2020 she would have 1 every few months. Starts vision vision changes, starts with a  tiny dot and then begins to spread in both eyes, can start with an aura but not always. After she lays down and sleeps she can feel better, a headache follows the aura, dull, until this year the aura would go away and then feel bad afterwards as a postdrome, but these last few migraines and visual auras have been different/worse. Headache is on the right side, unilateral, in May she went to the ER because the aura was changed, more persistent, loss of peripheral vision, more persistent, headache was more intense, throbbing/pulsating/pounding or dull, can both both sides or unilateral, dizziness, photo/phonophobia, no significant nausea or vomiting. May was her most recent migraine with similar changes, January is when her vision auras changed, and in may the symptoms were worse and more  persistent as well. She went to the ED but no imaging was completed. Primary care gave her sumatriptan. She has to lay down and stay still, moving makes it worse, positional,mother had a TIA at the age of 36. Can be exacerbated by menses. No other focal neurologic deficits, associated symptoms, inciting events or modifiable factors.   Reviewed notes, labs and imaging from outside physicians, which showed: See above  From a thorough review of records, medications tried that can be used in migraine treatment include: Tylenol, ibuprofen, Decadron injections, Benadryl, etodolac, gabapentin, amitriptyline, ketorolac injections, Zofran injections, Compazine tablets, Phenergan injections, tramadol.`  Review of Systems: Patient complains of symptoms per HPI as well as the following symptoms vision loss. Pertinent negatives and positives per HPI. All others negative.   Social History   Socioeconomic History   Marital status: Married    Spouse name: Not on file   Number of children: Not on file   Years of education: Not on file   Highest education level: Not on file  Occupational History   Not on file  Tobacco Use   Smoking status: Never   Smokeless tobacco: Never  Vaping Use   Vaping Use: Never used  Substance and Sexual Activity   Alcohol use: Yes    Comment: weekly   Drug use: No   Sexual activity: Yes  Other Topics Concern   Not on file  Social History Narrative   Coffee daily (once cup) half and half caffeine. Working:  Armed forces operational officer.  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    Social Determinants of  Health   Financial Resource Strain: Not on file  Food Insecurity: Not on file  Transportation Needs: Not on file  Physical Activity: Not on file  Stress: Not on file  Social Connections: Not on file  Intimate Partner Violence: Not on file    Family History  Problem Relation Age of Onset   Stroke Mother    Melanoma Mother    Diabetes Mellitus I Father    Migraines  Sister    Colon cancer Maternal Grandfather     Past Medical History:  Diagnosis Date   Gestational thrombocytopenia without hemorrhage in third trimester (HCC)    Menstrual migraine     Patient Active Problem List   Diagnosis Date Noted   Migraine with aura and without status migrainosus, not intractable 05/10/2021   Acute pelvic pain 01/30/2017   Ovarian cyst 01/30/2017    Past Surgical History:  Procedure Laterality Date   LAPAROSCOPY Right 01/30/2017   Procedure: LAPAROSCOPY DIAGNOSTIC/ OVARIAN CYST POSSIBLE TORSION, Removal of IUD;  Surgeon: Leola Brazil, MD;  Location: [facility] ORS;  Service: Gynecology;  Laterality: Right;   WISDOM TOOTH EXTRACTION      Current Outpatient Medications  Medication Sig Dispense Refill   Aspirin-Acetaminophen-Caffeine (EXCEDRIN MIGRAINE PO) Take by mouth as needed.     No current facility-administered medications for this visit.    Allergies as of 05/10/2021 - Review Complete 05/10/2021  Allergen Reaction Noted   Mango butter Rash 03/16/2015    Vitals: BP 115/72   Pulse 71   Ht 5\' 5"  (1.651 m)   Wt 171 lb 12.8 oz (77.9 kg)   BMI 28.59 kg/m  Last Weight:  Wt Readings from Last 1 Encounters:  05/10/21 171 lb 12.8 oz (77.9 kg)   Last Height:   Ht Readings from Last 1 Encounters:  05/10/21 5\' 5"  (1.651 m)     Physical exam: Exam: Gen: NAD, conversant, well nourised, well groomed                     CV: RRR, no MRG. No Carotid Bruits. No peripheral edema, warm, nontender Eyes: Conjunctivae clear without exudates or hemorrhage  Neuro: Detailed Neurologic Exam  Speech:    Speech is normal; fluent and spontaneous with normal comprehension.  Cognition:    The patient is oriented to person, place, and time;     recent and remote memory intact;     language fluent;     normal attention, concentration,     fund of knowledge Cranial Nerves:    The pupils are equal, round, and reactive to light. The fundi are flat. Visual  fields are full to finger confrontation. Extraocular movements are intact. Trigeminal sensation is intact and the muscles of mastication are normal. The face is symmetric. The palate elevates in the midline. Hearing intact. Voice is normal. Shoulder shrug is normal. The tongue has normal motion without fasciculations.   Coordination:    Normal finger to nose   Gait:    normal.   Motor Observation:    No asymmetry, no atrophy, and no involuntary movements noted. Tone:    Normal muscle tone.    Posture:    Posture is normal. normal erect    Strength:    Strength is V/V in the upper and lower limbs.      Sensation: intact to LT     Reflex Exam:  DTR's:    Deep tendon reflexes in the upper and lower extremities are normal bilaterally.  Toes:    The toes are downgoing bilaterally.   Clonus:    Clonus is absent.    Assessment/Plan: 35 year old patient with migraines with aura.  However given worsening symptoms, changes in vision and other concerning symptoms recommend thorough evaluation.  - We discussed risk of stroke in patients with migraine with aura -For acute management patient prefers to stay with Excedrin Migraine, we did discuss triptans and other emergent medications, at this time she does not even want to take Compazine. She also has Sumatriptan she has not tried.  - MRI brain due to concerning symptoms of worsening headaches, positional headaches,vision changes  to look for space occupying mass, chiari or intracranial hypertension (pseudotumor), or other.   Orders Placed This Encounter  Procedures   MR BRAIN W WO CONTRAST   TSH   No orders of the defined types were placed in this encounter.  Discussed: To prevent or relieve headaches, try the following: Cool Compress. Lie down and place a cool compress on your head.  Avoid headache triggers. If certain foods or odors seem to have triggered your migraines in the past, avoid them. A headache diary might help you  identify triggers.  Include physical activity in your daily routine. Try a daily walk or other moderate aerobic exercise.  Manage stress. Find healthy ways to cope with the stressors, such as delegating tasks on your to-do list.  Practice relaxation techniques. Try deep breathing, yoga, massage and visualization.  Eat regularly. Eating regularly scheduled meals and maintaining a healthy diet might help prevent headaches. Also, drink plenty of fluids.  Follow a regular sleep schedule. Sleep deprivation might contribute to headaches Consider biofeedback. With this mind-body technique, you learn to control certain bodily functions -- such as muscle tension, heart rate and blood pressure -- to prevent headaches or reduce headache pain.    Proceed to emergency room if you experience new or worsening symptoms or symptoms do not resolve, if you have new neurologic symptoms or if headache is severe, or for any concerning symptom.   Provided education and documentation from American headache Society toolbox including articles on: chronic migraine medication overuse headache, chronic migraines, prevention of migraines, behavioral and other nonpharmacologic treatments for headache.   Cc: Ward, Elenora Fender, MD,  Kandyce Rud, MD  Naomie Dean, MD  [facility] [address] [place], [place] [address]  Phone [phone] Fax [phone]

## 2021-05-11 LAB — TSH: TSH: 1.97 u[IU]/mL (ref 0.450–4.500)

## 2021-11-09 ENCOUNTER — Other Ambulatory Visit: Payer: Self-pay

## 2021-11-09 ENCOUNTER — Encounter: Payer: Self-pay | Admitting: Emergency Medicine

## 2021-11-09 ENCOUNTER — Ambulatory Visit (INDEPENDENT_AMBULATORY_CARE_PROVIDER_SITE_OTHER): Payer: BC Managed Care – PPO

## 2021-11-09 ENCOUNTER — Ambulatory Visit
Admission: EM | Admit: 2021-11-09 | Discharge: 2021-11-09 | Disposition: A | Discharge status: Home / Self Care | Payer: BC Managed Care – PPO

## 2021-11-09 DIAGNOSIS — B349 Viral infection, unspecified: Secondary | ICD-10-CM | POA: Diagnosis not present

## 2021-11-09 DIAGNOSIS — R059 Cough, unspecified: Secondary | ICD-10-CM | POA: Diagnosis not present

## 2021-11-09 DIAGNOSIS — R058 Other specified cough: Secondary | ICD-10-CM

## 2021-11-09 DIAGNOSIS — R0989 Other specified symptoms and signs involving the circulatory and respiratory systems: Secondary | ICD-10-CM

## 2021-11-09 MED ORDER — ALBUTEROL SULFATE HFA 108 (90 BASE) MCG/ACT IN AERS: 1.0000 | INHALATION_SPRAY | Freq: Four times a day (QID) | RESPIRATORY_TRACT | 0 refills | Status: AC | PRN

## 2021-11-09 MED ORDER — PREDNISONE 20 MG PO TABS: ORAL_TABLET | ORAL | 0 refills | Status: AC

## 2021-11-09 MED ORDER — PROMETHAZINE-DM 6.25-15 MG/5ML PO SYRP: 5.0000 mL | ORAL_SOLUTION | Freq: Every evening | ORAL | 0 refills | Status: AC | PRN

## 2021-11-09 MED ORDER — BENZONATATE 100 MG PO CAPS: 100.0000 mg | ORAL_CAPSULE | Freq: Three times a day (TID) | ORAL | 0 refills | Status: AC | PRN

## 2021-11-09 NOTE — ED Provider Notes (Signed)
Joyce Schwartz   MRN: [id number] DOB: Apr 29, 1986  Subjective:   Joyce Schwartz is a 36 y.o. female presenting for 5-day history of acute onset sinus congestion, coughing, sinus pressure.  She has no felt more midsternal chest pain chest congestion and fullness.  Has a history of bronchitis when she was pregnant.  Had to use an inhaler then but otherwise has not had to do so.  She is not a smoker.  Does not vape.  No current facility-administered medications for this encounter.  Current Outpatient Medications:    Aspirin-Acetaminophen-Caffeine (EXCEDRIN MIGRAINE PO), Take by mouth as needed., Disp: , Rfl:    Allergies  Allergen Reactions   Mango Butter Rash    Past Medical History:  Diagnosis Date   Gestational thrombocytopenia without hemorrhage in third trimester (HCC)    Menstrual migraine      Past Surgical History:  Procedure Laterality Date   LAPAROSCOPY Right 01/30/2017   Procedure: LAPAROSCOPY DIAGNOSTIC/ OVARIAN CYST POSSIBLE TORSION, Removal of IUD;  Surgeon: Ward, Elenora Fender, MD;  Location: [facility] OR[facility];  Service: Gynecology;  Laterality: Right;   WISDOM TOOTH EXTRACTION      Family History  Problem Relation Age of Onset   Stroke Mother    Melanoma Mother    Diabetes Mellitus I Father    Migraines Sister    Colon cancer Maternal Grandfather     Social History   Tobacco Use   Smoking status: Never   Smokeless tobacco: Never  Vaping Use   Vaping Use: Never used  Substance Use Topics   Alcohol use: Yes    Comment: weekly   Drug use: No    ROS   Objective:   Vitals: BP 131/86 (BP Location: Left Arm)    Pulse 76    Temp 98.3 F (36.8 C) (Oral)    Resp 16    LMP  (LMP Unknown)    SpO2 98%   Physical Exam Constitutional:      General: She is not in acute distress.    Appearance: Normal appearance. She is well-developed. She is not ill-appearing, toxic-appearing or diaphoretic.  HENT:     Head: Normocephalic and atraumatic.     Right Ear:  External ear normal.     Left Ear: External ear normal.     Nose: No congestion or rhinorrhea.     Mouth/Throat:     Mouth: Mucous membranes are moist.     Pharynx: No oropharyngeal exudate or posterior oropharyngeal erythema.     Comments: Thick streaks of post-nasal drainage overlying pharynx.  Eyes:     General: No scleral icterus.       Right eye: No discharge.        Left eye: No discharge.     Extraocular Movements: Extraocular movements intact.     Conjunctiva/sclera: Conjunctivae normal.  Cardiovascular:     Rate and Rhythm: Normal rate.     Heart sounds: No murmur heard.   No friction rub. No gallop.  Pulmonary:     Effort: Pulmonary effort is normal. No respiratory distress.     Breath sounds: No stridor. Examination of the right-middle field reveals rhonchi. Examination of the right-lower field reveals rhonchi. Rhonchi present. No wheezing or rales.  Chest:     Chest wall: No tenderness.  Skin:    General: Skin is warm and dry.  Neurological:     General: No focal deficit present.     Mental Status: She is alert and oriented to person,  place, and time.  Psychiatric:        Mood and Affect: Mood normal.        Behavior: Behavior normal.        Thought Content: Thought content normal.        Judgment: Judgment normal.    DG Chest 2 View  Result Date: 11/09/2021 CLINICAL DATA:  Rhonchi and productive cough x5 days. EXAM: CHEST - 2 VIEW COMPARISON:  June 22, 2015 FINDINGS: The heart size and mediastinal contours are within normal limits. No focal airspace consolidation. No pleural effusion. No pneumothorax. The visualized skeletal structures are unremarkable. IMPRESSION: No acute cardiopulmonary disease. Electronically Signed   By: Maudry Mayhew M.D.   On: 11/09/2021 17:34     Assessment and Plan :   PDMP not reviewed this encounter.  1. Acute viral syndrome   2. Productive cough   3. Chest congestion    Patient would like aggressive management therefore I  offered her an oral steroid course, albuterol inhaler.  Use cough suppression medications otherwise.  Deferred COVID-19 testing and I am in agreement given that she has had 5 days and has a negative chest x-ray, normal vital signs. Counseled patient on potential for adverse effects with medications prescribed/recommended today, ER and return-to-clinic precautions discussed, patient verbalized understanding.    Wallis Bamberg, PA-C 11/09/21 1800

## 2021-11-09 NOTE — ED Triage Notes (Signed)
Pt presents with cough, sinus pressure, congestions x 5 days

## 2022-03-21 DIAGNOSIS — Z01419 Encounter for gynecological examination (general) (routine) without abnormal findings: Secondary | ICD-10-CM | POA: Diagnosis not present

## 2022-03-21 DIAGNOSIS — R635 Abnormal weight gain: Secondary | ICD-10-CM | POA: Diagnosis not present

## 2022-03-21 DIAGNOSIS — R1032 Left lower quadrant pain: Secondary | ICD-10-CM | POA: Diagnosis not present

## 2022-03-21 DIAGNOSIS — N9489 Other specified conditions associated with female genital organs and menstrual cycle: Secondary | ICD-10-CM | POA: Diagnosis not present

## 2022-03-22 DIAGNOSIS — R1032 Left lower quadrant pain: Secondary | ICD-10-CM | POA: Diagnosis not present

## 2022-03-22 DIAGNOSIS — N83202 Unspecified ovarian cyst, left side: Secondary | ICD-10-CM | POA: Diagnosis not present

## 2022-03-22 DIAGNOSIS — N9489 Other specified conditions associated with female genital organs and menstrual cycle: Secondary | ICD-10-CM | POA: Diagnosis not present

## 2022-05-03 DIAGNOSIS — N83209 Unspecified ovarian cyst, unspecified side: Secondary | ICD-10-CM | POA: Diagnosis not present

## 2022-05-25 DIAGNOSIS — B3749 Other urogenital candidiasis: Secondary | ICD-10-CM | POA: Diagnosis not present

## 2022-09-21 DIAGNOSIS — Z713 Dietary counseling and surveillance: Secondary | ICD-10-CM | POA: Diagnosis not present

## 2022-10-03 DIAGNOSIS — Z713 Dietary counseling and surveillance: Secondary | ICD-10-CM | POA: Diagnosis not present

## 2022-11-07 DIAGNOSIS — Z713 Dietary counseling and surveillance: Secondary | ICD-10-CM | POA: Diagnosis not present

## 2022-12-05 DIAGNOSIS — R208 Other disturbances of skin sensation: Secondary | ICD-10-CM | POA: Diagnosis not present

## 2022-12-05 DIAGNOSIS — L918 Other hypertrophic disorders of the skin: Secondary | ICD-10-CM | POA: Diagnosis not present

## 2022-12-05 DIAGNOSIS — D2271 Melanocytic nevi of right lower limb, including hip: Secondary | ICD-10-CM | POA: Diagnosis not present

## 2022-12-05 DIAGNOSIS — L538 Other specified erythematous conditions: Secondary | ICD-10-CM | POA: Diagnosis not present

## 2022-12-05 DIAGNOSIS — D225 Melanocytic nevi of trunk: Secondary | ICD-10-CM | POA: Diagnosis not present

## 2022-12-05 DIAGNOSIS — D2262 Melanocytic nevi of left upper limb, including shoulder: Secondary | ICD-10-CM | POA: Diagnosis not present

## 2022-12-05 DIAGNOSIS — D2261 Melanocytic nevi of right upper limb, including shoulder: Secondary | ICD-10-CM | POA: Diagnosis not present

## 2022-12-19 DIAGNOSIS — Z713 Dietary counseling and surveillance: Secondary | ICD-10-CM | POA: Diagnosis not present

## 2023-03-20 ENCOUNTER — Ambulatory Visit
Admission: EM | Admit: 2023-03-20 | Discharge: 2023-03-20 | Disposition: A | Discharge status: Home / Self Care | Payer: BC Managed Care – PPO | Attending: Emergency Medicine | Admitting: Emergency Medicine

## 2023-03-20 DIAGNOSIS — J069 Acute upper respiratory infection, unspecified: Secondary | ICD-10-CM

## 2023-03-20 DIAGNOSIS — J029 Acute pharyngitis, unspecified: Secondary | ICD-10-CM | POA: Diagnosis not present

## 2023-03-20 LAB — POCT RAPID STREP A (OFFICE): Rapid Strep A Screen: NEGATIVE

## 2023-03-20 NOTE — Discharge Instructions (Addendum)
The strep test is negative.  Follow up with your primary care provider if your symptoms are not improving.    

## 2023-03-20 NOTE — ED Triage Notes (Signed)
Patient to Urgent Care with complaints of sore throat that started on Friday night. Reports that she started to see white spots in the back of her throat today. Denies any known fevers.

## 2023-03-20 NOTE — ED Provider Notes (Signed)
Joyce Schwartz    CSN: [id number] Arrival date & time: 03/20/23  1810      History   Chief Complaint Chief Complaint  Patient presents with   Sore Throat    Entered by patient    HPI Joyce Schwartz is a 37 y.o. female.  Patient presents with 3-day history of sore throat.  She also reports mild intermittent nonproductive cough.  No fever, rash, shortness of breath, or other symptoms.  Treating with ibuprofen; last taken this morning.  Her medical history includes migraine headache.  The history is provided by the patient and medical records.    Past Medical History:  Diagnosis Date   Gestational thrombocytopenia without hemorrhage in third trimester (HCC)    Menstrual migraine     Patient Active Problem List   Diagnosis Date Noted   Migraine with aura and without status migrainosus, not intractable 05/10/2021   Acute pelvic pain 01/30/2017   Ovarian cyst 01/30/2017    Past Surgical History:  Procedure Laterality Date   LAPAROSCOPY Right 01/30/2017   Procedure: LAPAROSCOPY DIAGNOSTIC/ OVARIAN CYST POSSIBLE TORSION, Removal of IUD;  Surgeon: Leola Brazil, MD;  Location: [facility] OR[facility];  Service: Gynecology;  Laterality: Right;   WISDOM TOOTH EXTRACTION      OB History     Gravida  2   Para  2   Term  2   Preterm      AB      Living  2      SAB      IAB      Ectopic      Multiple  0   Live Births  2            Home Medications    Prior to Admission medications   Medication Sig Start Date End Date Taking? Authorizing Provider  albuterol (VENTOLIN HFA) 108 (90 Base) MCG/ACT inhaler Inhale 1-2 puffs into the lungs every 6 (six) hours as needed for wheezing or shortness of breath. 11/09/21   Wallis Bamberg, PA-C  Aspirin-Acetaminophen-Caffeine (EXCEDRIN MIGRAINE PO) Take by mouth as needed.    [provider]  benzonatate (TESSALON) 100 MG capsule Take 1-2 capsules (100-200 mg total) by mouth 3 (three) times daily as needed for cough.  11/09/21   Wallis Bamberg, PA-C  predniSONE (DELTASONE) 20 MG tablet Take 2 tablets daily with breakfast. 11/09/21   Wallis Bamberg, PA-C  promethazine-dextromethorphan (PROMETHAZINE-DM) 6.25-15 MG/5ML syrup Take 5 mLs by mouth at bedtime as needed for cough. 11/09/21   Wallis Bamberg, PA-C  SUMAtriptan (IMITREX) 100 MG tablet Take by mouth. 03/22/21 03/22/22  [provider]    Family History Family History  Problem Relation Age of Onset   Stroke Mother    Melanoma Mother    Diabetes Mellitus I Father    Migraines Sister    Colon cancer Maternal Grandfather     Social History Social History   Tobacco Use   Smoking status: Never   Smokeless tobacco: Never  Vaping Use   Vaping Use: Never used  Substance Use Topics   Alcohol use: Yes    Comment: weekly   Drug use: No     Allergies   Mango butter   Review of Systems Review of Systems  Constitutional:  Negative for chills and fever.  HENT:  Positive for sore throat. Negative for ear pain.   Respiratory:  Positive for cough. Negative for shortness of breath.   Cardiovascular:  Negative for chest pain and  palpitations.  Gastrointestinal:  Negative for diarrhea and vomiting.  Skin:  Negative for color change and rash.  All other systems reviewed and are negative.    Physical Exam Triage Vital Signs ED Triage Vitals  Enc Vitals Group     BP 03/20/23 1839 122/78     Pulse Rate 03/20/23 1817 77     Resp 03/20/23 1817 18     Temp 03/20/23 1817 98.1 F (36.7 C)     Temp src --      SpO2 03/20/23 1817 98 %     Weight --      Height --      Head Circumference --      Peak Flow --      Pain Score 03/20/23 1834 3     Pain Loc --      Pain Edu? --      Excl. in GC? --    No data found.  Updated Vital Signs BP 122/78   Pulse 77   Temp 98.1 F (36.7 C)   Resp 18   LMP 03/17/2023   SpO2 98%   Visual Acuity Right Eye Distance:   Left Eye Distance:   Bilateral Distance:    Right Eye Near:   Left Eye Near:     Bilateral Near:     Physical Exam Vitals and nursing note reviewed.  Constitutional:      General: She is not in acute distress.    Appearance: Normal appearance. She is well-developed.  HENT:     Head: Atraumatic.     Right Ear: Tympanic membrane normal.     Left Ear: Tympanic membrane normal.     Nose: Nose normal.     Mouth/Throat:     Mouth: Mucous membranes are moist.     Pharynx: Posterior oropharyngeal erythema present.  Cardiovascular:     Rate and Rhythm: Normal rate and regular rhythm.     Heart sounds: Normal heart sounds.  Pulmonary:     Effort: Pulmonary effort is normal. No respiratory distress.     Breath sounds: Normal breath sounds.  Musculoskeletal:     Cervical back: Neck supple.  Skin:    General: Skin is warm and dry.  Neurological:     Mental Status: She is alert.  Psychiatric:        Mood and Affect: Mood normal.        Behavior: Behavior normal.      UC Treatments / Results  Labs (all labs ordered are listed, but only abnormal results are displayed) Labs Reviewed  POCT RAPID STREP A (OFFICE)    EKG   Radiology No results found.  Procedures Procedures (including critical care time)  Medications Ordered in UC Medications - No data to display  Initial Impression / Assessment and Plan / UC Course  I have reviewed the triage vital signs and the nursing notes.  Pertinent labs & imaging results that were available during my care of the patient were reviewed by me and considered in my medical decision making (see chart for details).    Viral pharyngitis, viral URI.  Rapid strep negative.  Discussed symptomatic treatment including Tylenol or ibuprofen as needed.  Education provided on pharyngitis and URI.  Instructed patient to follow up with her PCP if her symptoms are not improving.  She agrees to plan of care.    Final Clinical Impressions(s) / UC Diagnoses   Final diagnoses:  Viral pharyngitis  Viral URI  Discharge  Instructions      The strep test is negative.  Follow up with your primary care provider if your symptoms are not improving.        ED Prescriptions   None    PDMP not reviewed this encounter.   Mickie Bail, NP 03/20/23 [phone]

## 2023-04-10 DIAGNOSIS — Z131 Encounter for screening for diabetes mellitus: Secondary | ICD-10-CM | POA: Diagnosis not present

## 2023-04-10 DIAGNOSIS — Z13220 Encounter for screening for lipoid disorders: Secondary | ICD-10-CM | POA: Diagnosis not present

## 2023-04-10 DIAGNOSIS — Z01419 Encounter for gynecological examination (general) (routine) without abnormal findings: Secondary | ICD-10-CM | POA: Diagnosis not present

## 2023-04-10 DIAGNOSIS — Z0000 Encounter for general adult medical examination without abnormal findings: Secondary | ICD-10-CM | POA: Diagnosis not present

## 2023-05-06 IMAGING — DX DG CHEST 2V
2 series · 2 of 2 positions shown · non-contrast
Comparison: June 22, 2015

CLINICAL DATA: Rhonchi and productive cough x5 days.

EXAM:
CHEST - 2 VIEW

[chest pa]
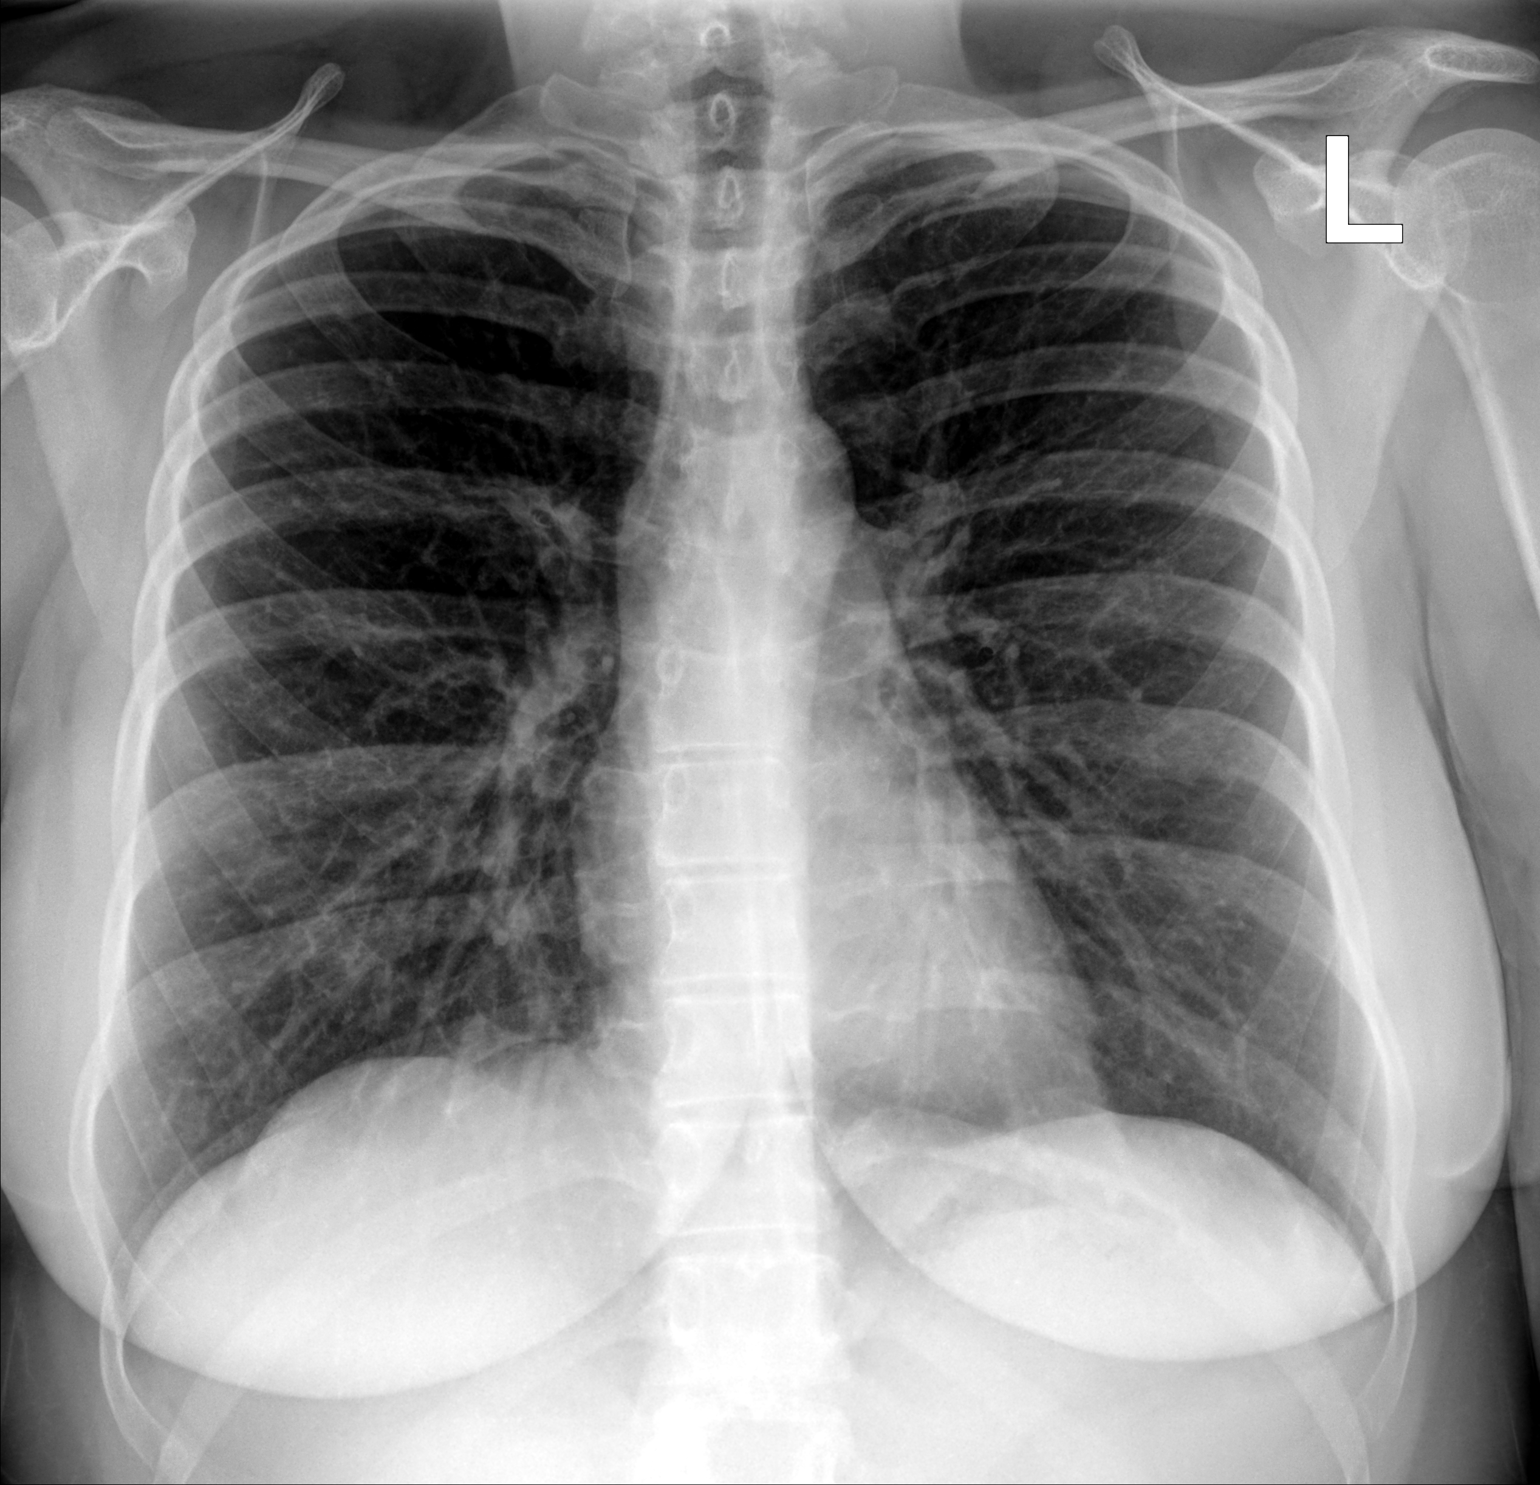

[chest lat]
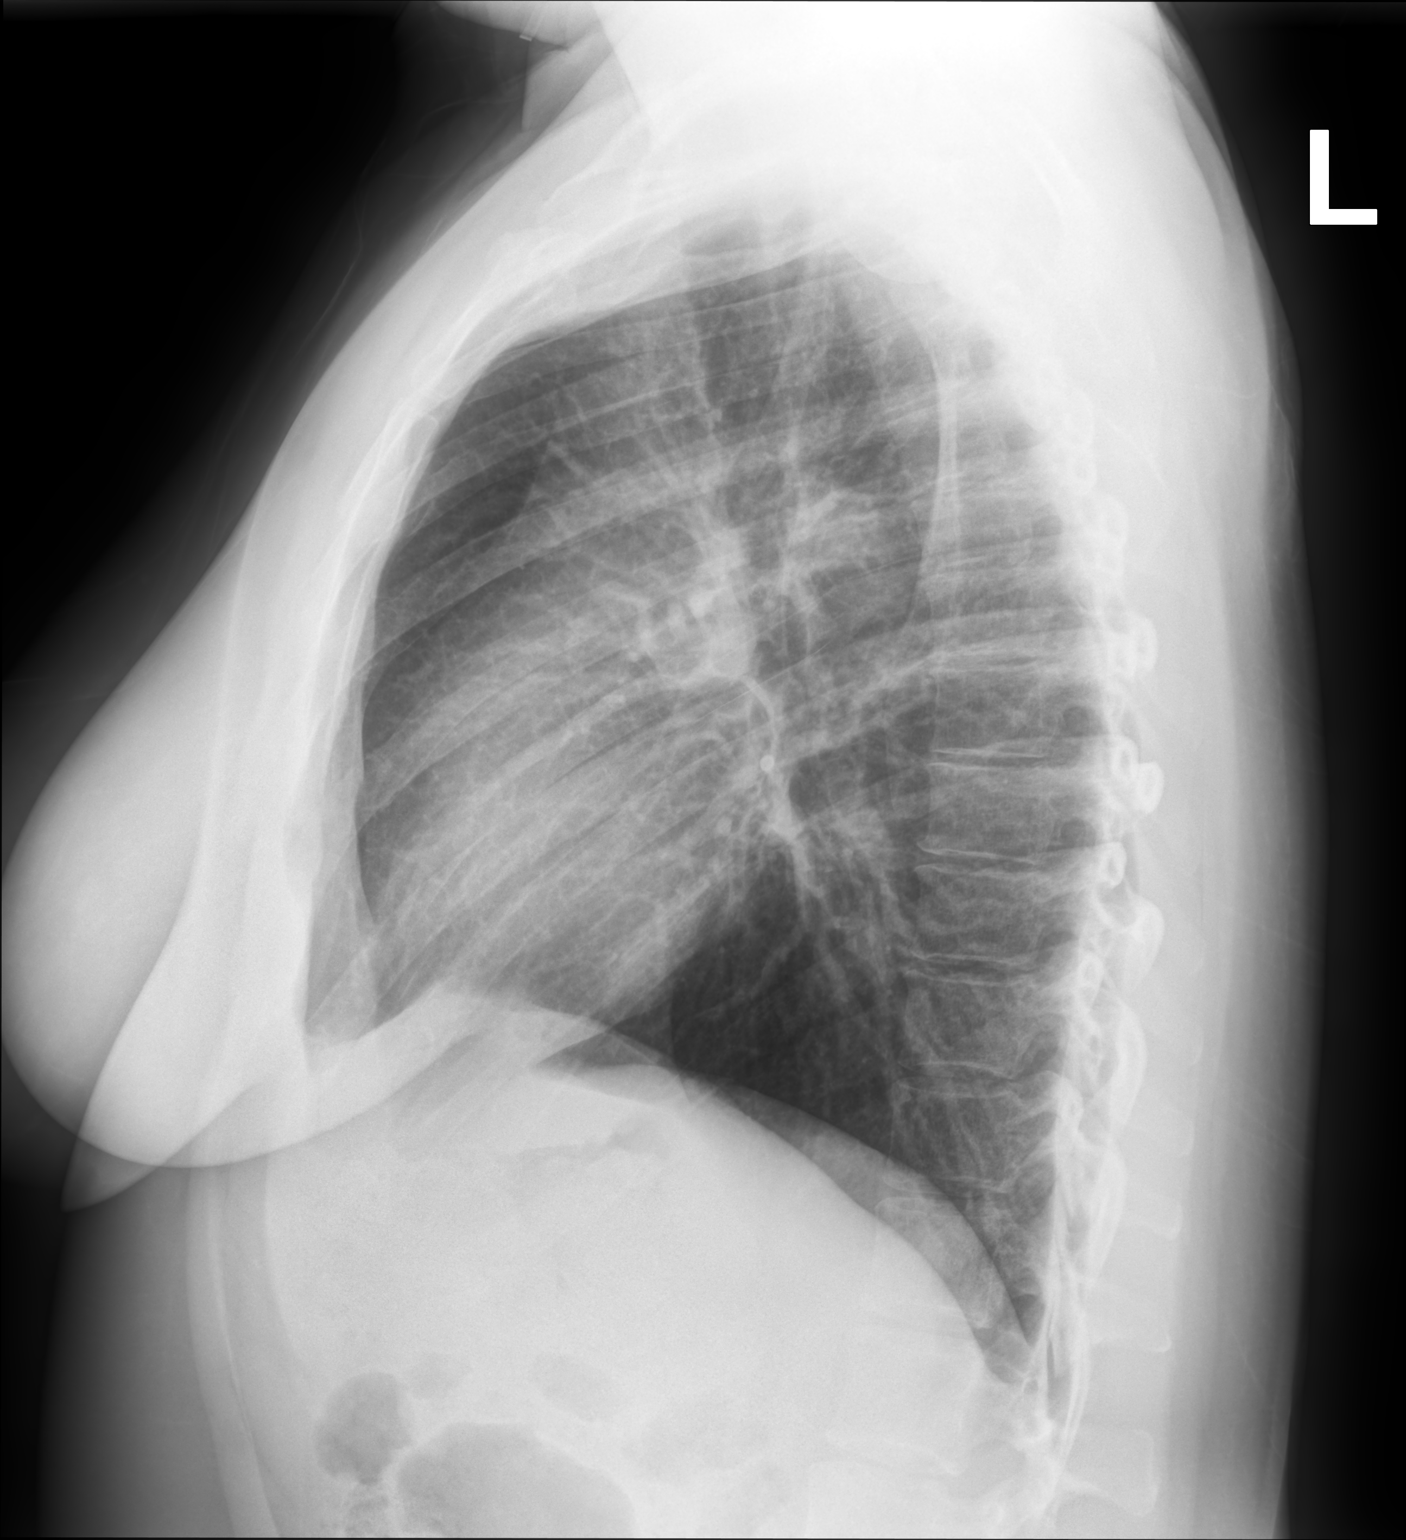

[2 of 2 positions shown; findings below may reference images not displayed]

FINDINGS: The heart size and mediastinal contours are within normal limits. No
focal airspace consolidation. No pleural effusion. No pneumothorax.
The visualized skeletal structures are unremarkable.
IMPRESSION: No acute cardiopulmonary disease.

## 2023-08-25 DIAGNOSIS — J019 Acute sinusitis, unspecified: Secondary | ICD-10-CM | POA: Diagnosis not present

## 2023-10-08 DIAGNOSIS — N760 Acute vaginitis: Secondary | ICD-10-CM | POA: Diagnosis not present

## 2023-12-18 DIAGNOSIS — D2261 Melanocytic nevi of right upper limb, including shoulder: Secondary | ICD-10-CM | POA: Diagnosis not present

## 2023-12-18 DIAGNOSIS — D2262 Melanocytic nevi of left upper limb, including shoulder: Secondary | ICD-10-CM | POA: Diagnosis not present

## 2023-12-18 DIAGNOSIS — D2271 Melanocytic nevi of right lower limb, including hip: Secondary | ICD-10-CM | POA: Diagnosis not present

## 2023-12-18 DIAGNOSIS — D2272 Melanocytic nevi of left lower limb, including hip: Secondary | ICD-10-CM | POA: Diagnosis not present

## 2024-03-11 DIAGNOSIS — Z1331 Encounter for screening for depression: Secondary | ICD-10-CM | POA: Diagnosis not present

## 2024-03-11 DIAGNOSIS — E538 Deficiency of other specified B group vitamins: Secondary | ICD-10-CM | POA: Diagnosis not present

## 2024-03-11 DIAGNOSIS — D696 Thrombocytopenia, unspecified: Secondary | ICD-10-CM | POA: Diagnosis not present

## 2024-03-11 DIAGNOSIS — Z0000 Encounter for general adult medical examination without abnormal findings: Secondary | ICD-10-CM | POA: Diagnosis not present

## 2024-03-18 DIAGNOSIS — E538 Deficiency of other specified B group vitamins: Secondary | ICD-10-CM | POA: Diagnosis not present

## 2024-03-18 DIAGNOSIS — E66811 Obesity, class 1: Secondary | ICD-10-CM | POA: Diagnosis not present

## 2024-04-15 DIAGNOSIS — Z01419 Encounter for gynecological examination (general) (routine) without abnormal findings: Secondary | ICD-10-CM | POA: Diagnosis not present
